# Patient Record
Sex: Male | Born: 1940 | Race: White | Hispanic: No | Marital: Married | Smoking: Former smoker
Health system: Southern US, Community
[De-identification: ages and names within clinical notes are randomized; demographics above are authoritative.]

## PROBLEM LIST (undated history)

## (undated) DIAGNOSIS — R079 Chest pain, unspecified: Secondary | ICD-10-CM

## (undated) DIAGNOSIS — I4891 Unspecified atrial fibrillation: Secondary | ICD-10-CM

## (undated) DIAGNOSIS — I200 Unstable angina: Secondary | ICD-10-CM

## (undated) DIAGNOSIS — E782 Mixed hyperlipidemia: Secondary | ICD-10-CM

## (undated) DIAGNOSIS — R9430 Abnormal result of cardiovascular function study, unspecified: Secondary | ICD-10-CM

## (undated) DIAGNOSIS — C61 Malignant neoplasm of prostate: Secondary | ICD-10-CM

## (undated) DIAGNOSIS — I10 Essential (primary) hypertension: Secondary | ICD-10-CM

## (undated) HISTORY — PX: HERNIA REPAIR: SHX51

## (undated) HISTORY — PX: PROSTATECTOMY: SHX69

---

## 1998-01-18 ENCOUNTER — Encounter: Payer: Self-pay | Admitting: *Deleted

## 1998-01-18 ENCOUNTER — Ambulatory Visit (HOSPITAL_COMMUNITY): Admission: RE | Admit: 1998-01-18 | Discharge: 1998-01-18 | Payer: Self-pay | Admitting: *Deleted

## 1999-11-10 ENCOUNTER — Inpatient Hospital Stay (HOSPITAL_COMMUNITY): Admission: EM | Admit: 1999-11-10 | Discharge: 1999-11-11 | Payer: Self-pay | Admitting: Emergency Medicine

## 1999-11-10 ENCOUNTER — Encounter: Payer: Self-pay | Admitting: Emergency Medicine

## 1999-12-18 ENCOUNTER — Ambulatory Visit (HOSPITAL_BASED_OUTPATIENT_CLINIC_OR_DEPARTMENT_OTHER): Admission: RE | Admit: 1999-12-18 | Discharge: 1999-12-18 | Payer: Self-pay | Admitting: Family Medicine

## 2001-06-09 ENCOUNTER — Encounter (INDEPENDENT_AMBULATORY_CARE_PROVIDER_SITE_OTHER): Payer: Self-pay

## 2001-06-09 ENCOUNTER — Ambulatory Visit (HOSPITAL_COMMUNITY): Admission: RE | Admit: 2001-06-09 | Discharge: 2001-06-09 | Payer: Self-pay | Admitting: Gastroenterology

## 2002-08-07 ENCOUNTER — Encounter (HOSPITAL_BASED_OUTPATIENT_CLINIC_OR_DEPARTMENT_OTHER): Payer: Self-pay | Admitting: General Surgery

## 2002-08-12 ENCOUNTER — Encounter (INDEPENDENT_AMBULATORY_CARE_PROVIDER_SITE_OTHER): Payer: Self-pay | Admitting: *Deleted

## 2002-08-12 ENCOUNTER — Ambulatory Visit (HOSPITAL_COMMUNITY): Admission: RE | Admit: 2002-08-12 | Discharge: 2002-08-12 | Payer: Self-pay | Admitting: General Surgery

## 2010-02-14 ENCOUNTER — Encounter: Admission: RE | Admit: 2010-02-14 | Discharge: 2010-02-14 | Payer: Self-pay | Admitting: Family Medicine

## 2010-08-18 NOTE — H&P (Signed)
[facility]  Patient:    Vincent Hampton, Vincent Hampton                   MRN: [id number] Adm. Date:  11/10/99 Attending:  Francisca December, M.D. Dictator:   Anselm Lis, N.P. CC:         Anna Genre. Little, M.D.   History and Physical  DATE OF BIRTH:  27-Oct-1940  PRIMARY CARE Jonovan Boedecker:  Dr. Caryn Bee L. Little.  HISTORY OF PRESENT ILLNESS:  Vincent Hampton is a pleasant 70 year old male with a history of paroxysmal atrial fibrillation, first noted four years earlier.  He does have recurrent episodes that occur approximately twice a year and last for 20-30 minutes and seem to resolve with taking an additional Digoxin tablet.  Yesterday at approximately 11 a.m. he felt a "funny feeling" in his chest, typical when he has recurrent atrial fibrillation.  His pulse was irregular and rapid (question approximately 150s).  He felt malaise.  His wife noted that he looked clammy.  He took an additional Digoxin tablet.  At approximately 1 p.m. he took a nap, another tablet at approximately 2 p.m., and another tablet later that night.  Last evening he had transient left anterior chest achiness which he described as not mild and nonpleuritic.  This morning he felt okay, took his Digoxin tablet, ate breakfast, and then felt nauseated.  His blood pressure was 90/70.  He took an additional Digoxin and reported to the [facility] Emergency Room.  His electrocardiogram revealed atrial flutter, a 3:1 block, rate at 70.  His CBC and CMET were okay.  His CPK was 54 with MB fraction and troponin I pending. His digitalis level was 1.7.  A chest x-ray was clear.  PAST MEDICAL HISTORY: 1. Paroxysmal atrial fibrillation, first noted in May 1997, with rare    episodic recurrence.  Managed conservatively. 2. History of exertional chest pain.  In April 1997, had a stress thallium    which was normal. 3. History of prostate cancer with a radical prostatectomy in 1994.   No    chemotherapy or radiation therapy. 4. Right herniorrhaphy in 1996. 5. Seasonal allergies, for which he takes Claritin, with improvement. 6. Resection of left facial basal carcinoma. 7. Sleep apnea diagnosed in 1987, untreated. 8. Mitral valve prolapse by echocardiogram in April 1997.  Left atrial size    of 2.6 cm.  Normal left ventricle.  PAST SURGICAL HISTORY: 1. Prostatectomy. 2. Right hernia repair. 3. Basal cell carcinoma removal from left cheek. 4. Tonsillectomy.  ALLERGIES:  No known drug allergies.  No problems with sea food, shell fish, or iodinated products.  CURRENT MEDICATIONS: 1. Claritin 10 mg p.o. q.d. 2. Coated aspirin q.o.d. or so. 3. Digoxin 0.25 mg p.o. q.d. 4. Multivitamin q.d.  SOCIAL HISTORY/HABITS:  Tobacco:  Quit in 1982.  Prior smoked two to three packs per day for 30 years.  ETOH:  Negative.  Caffeine:  About equivalent of one cup a day.  The patient has been married for 18 years (second marriage). He has two daughters and one son alive and well from his first marriage.  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  FAMILY HISTORY:  His mother is age 13 and may have Alzheimers.  His father died at age 42, question of prostate cancer with metastasis.  He has a sister age 52, alive and well.  REVIEW OF SYSTEMS:  As  in the HPI and past medical history, otherwise wears progressive glasses.  Denies problems with lightheadedness, syncope, or near syncopal episodes.  Negative problems hearing.  Negative dysphagia to food or fluid.  No constipation, diarrhea, melena, or bright red blood per rectum. Negative dysuria or hematuria.  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  Negative pedal edema.  No orthopnea or PND.  PHYSICAL EXAMINATION:  VITAL SIGNS:  Blood pressure 106/88, heart rate initially 114, now 71, afebrile.  GENERAL:  He is a slender midde-aged male, in no apparent distress.  His  wife was in attendance.  HEENT/NECK:  Brisk bilateral carotid upstrokes without bruit.  No significant jugular venous distention.  CHEST:  Lung sounds clear with equal bilateral excursion.  CARDIAC:  A regular rate and rhythm without murmur, rub, or gallop.  Normal S1 and S2.  ABDOMEN:  Soft, nondistended.  Normoactive bowel sounds.  Negative abdominal aortic, renal, or femoral bruits.  No masses, no organomegaly appreciated, nontender to applied pressure.  EXTREMITIES:  With +2/4 bilateral radial, femoral, dorsalis pedis, and posterior tibial pulses.  Negative pedal edema.  NEUROLOGIC:  Cranial nerves II-XII grossly intact.  Alert and oriented x 3.  GENITOURINARY:  Deferred.  RECTAL:  Deferred.  LABORATORY DATA:  Sodium 140, K of 4, chloride 106, CO2 of 27, BUN 20, creatinine 1.0, glucose 140.  Liver function tests within normal limits. Hemoglobin 15.4, wbcs 7.7, platelets 323.  CPK 54 with MB fraction and troponin I pending.  Digoxin level 1.7.  Coags are pending.  Chest x-ray revealed COPD, no active disease.  Old granulomatous disease. Scoliosis.  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  IMPRESSION: 1. Recurrent paroxysmal atrial fibrillation with initial right ventricular    response, currently 3:1 flutter, with controlled ventricular rate, after    the patient is self-loaded with Digoxin.  His Digoxin level is 1.7.  The    patient had an echocardiogram in 1997, which was essentially normal,    with left atrial size of 2.6 cm.  Evidence of mitral valve prolapse. 2. History of sleep apnea, untreated. 3. History of chest discomfort which was evaluated by a stress thallium    in 1997, which was negative for ischemia.  PLAN: 1. Admit to telemetry, rule out myocardial infarction protocol, with     serial  cardiac enzymes and electrocardiogram. 2. Will recheck a 2-D echocardiogram, assessing for valvular or structural    problems. 3. Initiation of antidysrhythmic medication, choice pending Dr. Francisca December review. 4. Will initiate systemic anticoagulation. 5. Recheck TSH.DD:  11/10/99 TD:  11/10/99 Job: [id number]

## 2010-08-18 NOTE — Op Note (Signed)
NAME:  Vincent Hampton, Vincent Hampton NO.:  [id number]   MEDICAL RECORD NO.:  [id number]                   PATIENT TYPE:  OIB   LOCATION:  NA                                   FACILITY:  [facility]   PHYSICIAN:  Leonie Man, M.D.                DATE OF BIRTH:  [birth year]   DATE OF PROCEDURE:  08/12/2002  DATE OF DISCHARGE:                                 OPERATIVE REPORT   PREOPERATIVE DIAGNOSIS:  Left inguinal hernia.   POSTOPERATIVE DIAGNOSIS:  Left sliding inguinal hernia with direct  component.   PROCEDURE:  Repair of left sliding hernia.   SURGEON:  Leonie Man, M.D.   ASSISTANT:  Nurse.   ANESTHESIA:  General.   INDICATIONS:  This patient is a 70 year old man who is status post right  inguinal hernia approximately 15 years ago, who presents now with left-sided  groin bulge and pain, on evaluation noted to be a left inguinal hernia.  He  comes to the operating room following full discussion of the risks and  potential benefits of surgery.  All questions were answered and consent  obtained.   PROCEDURE:  Following the induction of satisfactory general anesthesia with  the patient positioned supinely, the left groin was prepped and draped and  included in the sterile operative field.  The lower abdominal crease on the  left side was infiltrated with 0.5% Marcaine with epinephrine and a  transverse incision made symmetrically to the right side of the incision.  It was carried down through the skin and subcutaneous tissues and extended  the dissection down to the external oblique aponeurosis.  Prior to the  injections of Marcaine that were used for that procedure, the external  oblique aponeurosis was opened up through the external inguinal ring with  protection of the __________ nerve which was retracted medially and  cephalad.  The spermatic cord was then dissected free from the transversalis  fascia and held with a Penrose drain.  Dissection around  the anteromedial  aspect of the cord revealed a large hernial sac, which included a portion of  the small bowel, which was intimately adhered to the medial wall of the sac.  The sac was opened.  The small bowel was dissected free and reduced into the  peritoneal cavity.  A pursestring suture of 2-0 silk was used to encircle  the sac.  The redundant sac was then amputated.  The direct floor was then  repaired with a polypropylene mesh, which was sewn in at the pubic tubercle  with a 2-0 Novofil suture and continued up along the conjoined tendon up to  the internal ring.  Using another suture from the pubic tubercle, which was  run with the mesh up along the shelving edge of the inguinal ligament up to  the internal ring, the mesh was split so as to allow easy protrusion of the  spermatic cord.  The tails  of the mesh were then trimmed and sutured into  the internal oblique muscles superior and lateral to the cord. All areas of  dissection were then checked for hemostasis.  Additional injections of 0.5%  Marcaine were used so as to prolong his analgesia.  Sponge, instruments, and  sharp counts were verified.  The external oblique aponeurosis was closed  over the spermatic cord with a running 2-0 Vicryl suture.  The Scarpa's  fascia and subcutaneous tissues were closed with a running 3-0 Vicryl  suture, and the skin was closed with a 4-0 Monocryl suture, which was placed  subcuticularly.  The wound was then reinforced with Steri-Strips.  Sterile  dressings were applied.  The anesthetic reversed, and the patient was  removed from the operating room to the recovery room in stable condition.  He tolerated the procedure well.                                               Leonie Man, M.D.    PB/MEDQ  D:  08/12/2002  T:  08/13/2002  Job:  [id number]   cc:   Francisca December, M.D.  [address]  [address]  [place]  [place] [address]  Fax: [phone]   Anna Genre. Little, M.D.  [address]  [place]  [place] [address]  Fax: [phone]   Donnal Debar. Gasper Sells, M.D.  [address]  [place]  [place] [address]  Fax: [phone]

## 2010-08-18 NOTE — Procedures (Signed)
[facility]  Patient:    Vincent Hampton, Vincent Hampton Visit Number: [id number] MRN: [id number]          Service Type: END Location: ENDO Attending Physician:  Orland Mustard Dictated by:   Llana Aliment. Randa Evens, M.D. Proc. Date: 06/09/01 Admit Date:  06/09/2001   CC:         Caryn Bee L. Little, M.D.   Procedure Report  DATE OF BIRTH:  13-Sep-1940  PROCEDURE:  Colonoscopy and polypectomy.  MEDICATIONS:  Fentanyl 75 mcg, Versed 7 mg IV.  SCOPE:  Olympus adult video colonoscope.  INDICATIONS FOR PROCEDURE:  Colon cancer screening.  DESCRIPTION OF PROCEDURE:  The procedure had been explained to the patient and consent obtained. With the patient in the left lateral decubitus position, the Olympus adult video colonoscope was inserted and advanced under direct visualization. The prep was excellent. We were able to advance around to the cecum and the ileocecal valve was seen. The appendiceal orifice was seen. There was a 1/2 cm sessile polyp within about 4 cm of the appendix. This was removed with the snare and sucked through the scope. There was no significant bleeding at the polypectomy site. The scope was withdrawn and the ascending colon, hepatic flexure, transverse colon, splenic flexure, descending, and sigmoid colon were seen well. No further polyps were seen. There was on significant diverticular disease. The rectum was also seen well and was free of polyps. The scope withdrawn. The patient tolerated the procedure well maintained on low flow oxygen and pulse oximeter throughout the procedure.  ASSESSMENT:  Cecal polyp removed.  PLAN:  Routine post polypectomy instructions. Will recommend repeating procedure in three years. Dictated by:   Llana Aliment. Randa Evens, M.D. Attending Physician:  Orland Mustard DD:  06/09/01 TD:  06/10/01 Job: [id number]

## 2011-06-18 DIAGNOSIS — M2550 Pain in unspecified joint: Secondary | ICD-10-CM | POA: Diagnosis not present

## 2011-06-18 DIAGNOSIS — Z79899 Other long term (current) drug therapy: Secondary | ICD-10-CM | POA: Diagnosis not present

## 2011-07-16 DIAGNOSIS — Z85828 Personal history of other malignant neoplasm of skin: Secondary | ICD-10-CM | POA: Diagnosis not present

## 2011-07-16 DIAGNOSIS — L821 Other seborrheic keratosis: Secondary | ICD-10-CM | POA: Diagnosis not present

## 2011-09-04 DIAGNOSIS — D2310 Other benign neoplasm of skin of unspecified eyelid, including canthus: Secondary | ICD-10-CM | POA: Diagnosis not present

## 2011-09-04 DIAGNOSIS — H2510 Age-related nuclear cataract, unspecified eye: Secondary | ICD-10-CM | POA: Diagnosis not present

## 2011-12-17 DIAGNOSIS — I10 Essential (primary) hypertension: Secondary | ICD-10-CM | POA: Diagnosis not present

## 2011-12-17 DIAGNOSIS — I4891 Unspecified atrial fibrillation: Secondary | ICD-10-CM | POA: Diagnosis not present

## 2011-12-20 DIAGNOSIS — L719 Rosacea, unspecified: Secondary | ICD-10-CM | POA: Diagnosis not present

## 2011-12-20 DIAGNOSIS — D239 Other benign neoplasm of skin, unspecified: Secondary | ICD-10-CM | POA: Diagnosis not present

## 2011-12-20 DIAGNOSIS — L821 Other seborrheic keratosis: Secondary | ICD-10-CM | POA: Diagnosis not present

## 2011-12-20 DIAGNOSIS — Z85828 Personal history of other malignant neoplasm of skin: Secondary | ICD-10-CM | POA: Diagnosis not present

## 2012-01-14 DIAGNOSIS — Z23 Encounter for immunization: Secondary | ICD-10-CM | POA: Diagnosis not present

## 2012-03-28 DIAGNOSIS — R21 Rash and other nonspecific skin eruption: Secondary | ICD-10-CM | POA: Diagnosis not present

## 2012-04-24 ENCOUNTER — Observation Stay (HOSPITAL_COMMUNITY)
Admission: EM | Admit: 2012-04-24 | Discharge: 2012-04-25 | Disposition: A | Discharge status: Home / Self Care | Payer: Medicare Other | Attending: Interventional Cardiology | Admitting: Interventional Cardiology

## 2012-04-24 ENCOUNTER — Encounter (HOSPITAL_COMMUNITY): Payer: Self-pay | Admitting: Emergency Medicine

## 2012-04-24 ENCOUNTER — Emergency Department (HOSPITAL_COMMUNITY): Payer: Medicare Other

## 2012-04-24 DIAGNOSIS — E782 Mixed hyperlipidemia: Secondary | ICD-10-CM | POA: Diagnosis not present

## 2012-04-24 DIAGNOSIS — J449 Chronic obstructive pulmonary disease, unspecified: Secondary | ICD-10-CM | POA: Diagnosis not present

## 2012-04-24 DIAGNOSIS — R5381 Other malaise: Secondary | ICD-10-CM | POA: Diagnosis not present

## 2012-04-24 DIAGNOSIS — Z87891 Personal history of nicotine dependence: Secondary | ICD-10-CM | POA: Insufficient documentation

## 2012-04-24 DIAGNOSIS — R5383 Other fatigue: Secondary | ICD-10-CM | POA: Insufficient documentation

## 2012-04-24 DIAGNOSIS — I4510 Unspecified right bundle-branch block: Secondary | ICD-10-CM | POA: Insufficient documentation

## 2012-04-24 DIAGNOSIS — Z7902 Long term (current) use of antithrombotics/antiplatelets: Secondary | ICD-10-CM | POA: Diagnosis not present

## 2012-04-24 DIAGNOSIS — E785 Hyperlipidemia, unspecified: Secondary | ICD-10-CM | POA: Diagnosis not present

## 2012-04-24 DIAGNOSIS — I4891 Unspecified atrial fibrillation: Secondary | ICD-10-CM | POA: Diagnosis not present

## 2012-04-24 DIAGNOSIS — M79609 Pain in unspecified limb: Secondary | ICD-10-CM | POA: Insufficient documentation

## 2012-04-24 DIAGNOSIS — R079 Chest pain, unspecified: Secondary | ICD-10-CM | POA: Diagnosis not present

## 2012-04-24 DIAGNOSIS — I209 Angina pectoris, unspecified: Secondary | ICD-10-CM | POA: Diagnosis not present

## 2012-04-24 DIAGNOSIS — Z79899 Other long term (current) drug therapy: Secondary | ICD-10-CM | POA: Insufficient documentation

## 2012-04-24 DIAGNOSIS — R0789 Other chest pain: Principal | ICD-10-CM | POA: Insufficient documentation

## 2012-04-24 HISTORY — DX: Essential (primary) hypertension: I10

## 2012-04-24 HISTORY — DX: Unspecified atrial fibrillation: I48.91

## 2012-04-24 HISTORY — DX: Mixed hyperlipidemia: E78.2

## 2012-04-24 HISTORY — DX: Malignant neoplasm of prostate: C61

## 2012-04-24 HISTORY — DX: Chest pain, unspecified: R07.9

## 2012-04-24 LAB — COMPREHENSIVE METABOLIC PANEL
ALT: 23 U/L (ref 0–53)
AST: 22 U/L (ref 0–37)
Albumin: 3.6 g/dL (ref 3.5–5.2)
CO2: 30 mEq/L (ref 19–32)
Chloride: 99 mEq/L (ref 96–112)
GFR calc non Af Amer: 58 mL/min — ABNORMAL LOW (ref >90)
Potassium: 3.6 mEq/L (ref 3.5–5.1)
Sodium: 139 mEq/L (ref 135–145)
Total Bilirubin: 0.3 mg/dL (ref 0.3–1.2)

## 2012-04-24 LAB — PROTIME-INR: INR: 0.9 (ref 0.00–1.49)

## 2012-04-24 LAB — CBC WITH DIFFERENTIAL/PLATELET
Basophils Absolute: 0 10*3/uL (ref 0.0–0.1)
Basophils Relative: 1 % (ref 0–1)
HCT: 41.7 % (ref 39.0–52.0)
Lymphocytes Relative: 35 % (ref 12–46)
Monocytes Absolute: 0.5 10*3/uL (ref 0.1–1.0)
Neutro Abs: 3.2 10*3/uL (ref 1.7–7.7)
Neutrophils Relative %: 50 % (ref 43–77)
Platelets: 251 10*3/uL (ref 150–400)
RDW: 12.6 % (ref 11.5–15.5)
WBC: 6.4 10*3/uL (ref 4.0–10.5)

## 2012-04-24 MED ORDER — ONDANSETRON HCL 4 MG/2ML IJ SOLN
4.0000 mg | Freq: Once | INTRAMUSCULAR | Status: AC
  Administered 2012-04-24: 4 mg via INTRAVENOUS
  Filled 2012-04-24: qty 2

## 2012-04-24 MED ORDER — MORPHINE SULFATE 4 MG/ML IJ SOLN
4.0000 mg | Freq: Once | INTRAMUSCULAR | Status: AC
  Administered 2012-04-24: 4 mg via INTRAVENOUS
  Filled 2012-04-24: qty 1

## 2012-04-24 MED ORDER — NITROGLYCERIN 2 % TD OINT
0.5000 [in_us] | TOPICAL_OINTMENT | TRANSDERMAL | Status: AC
  Administered 2012-04-25: 0.5 [in_us] via TOPICAL
  Filled 2012-04-24: qty 1

## 2012-04-24 MED ORDER — NITROGLYCERIN 0.4 MG SL SUBL: 0.4000 mg | SUBLINGUAL_TABLET | SUBLINGUAL | Status: DC | PRN

## 2012-04-24 NOTE — ED Notes (Signed)
PT. REPORTS INTERMITTENT CHEST PAIN FOR SEVERAL DAYS AND THIS EVENING , LEFT SIDED CHEST PAIN AND LEFT ARM PAIN ONSET THIS EVENING , DENIES SOB , NAUSEA OR DIAPHORESIS , STATES HISTORY OF ATRIAL FIBRILLATION ( CARDIOLOGIST IS DR. VARANASI WITH [facility] ).

## 2012-04-24 NOTE — ED Provider Notes (Signed)
History  This chart was scribed for Vincent Octave, MD by Bennett Scrape, ED Scribe. This patient was seen in room D31C/D31C and the patient's care was started at 10:06 PM.  CSN: [id number]  Arrival date & time 04/24/12  1959   First MD Initiated Contact with Patient 04/24/12 2206      Chief Complaint  Patient presents with  . Chest Pain    The history is provided by the patient. No language interpreter was used.    Vincent Hampton is a 72 y.o. male who presents to the Emergency Department complaining of 4 to 5 days of sudden onset, non-changing, intermittent episodes of ventral bilateral arm pain that is worse on the left described as a ache with associated fatigue. He reports that he has been experiencing 2 to 3 episodes of pain lasting 20 to 30 minutes at a time daily and states that occasionally the pain will radiate into the left chest. He reports that he came in for evaluation tonight because this episode tonight has been longer lasting than the previous episodes (going on for approximately 2 hours).  He states that he is having left forearm pain currently. He denies any recent heavy lifting, falls or injuries to the extremities. He reports taking one baby ASA and two Advil today with no improvement. He denies having prior episodes of similar symptoms. He has a h/o A. Fib and reports that he is on lanoxin. He denies having any stents or prior MIs. He states that his last stress test was several years ago. He denies diaphoresis, SOB, nausea, emesis, numbness, weakness, abdominal pain, lightheadedness and dizziness as associated symptoms. He is a former smoker but denies alcohol use.  Cardiologist is Dr. Eldridge Dace with [facility].   Past Medical History  Diagnosis Date  . Atrial fibrillation   . Hypertension   . Prostate cancer     Past Surgical History  Procedure Date  . Prostatectomy   . Hernia repair     No family history on file.  History  Substance Use Topics  . Smoking  status: Former Games developer  . Smokeless tobacco: Not on file  . Alcohol Use: No      Review of Systems  A complete 10 system review of systems was obtained and all systems are negative except as noted in the HPI and PMH.   Allergies  Review of patient's allergies indicates no known allergies.  Home Medications   Current Outpatient Rx  Name  Route  Sig  Dispense  Refill  . DIGOXIN 0.25 MG PO TABS   Oral   Take 0.25 mg by mouth daily.         [name] DIPHENHYDRAMINE HCL 25 MG PO TABS   Oral   Take 25 mg by mouth every 6 (six) hours as needed. For seasonal allergies         . SOTALOL HCL 80 MG PO TABS   Oral   Take 80 mg by mouth 2 (two) times daily.         . TRIAMTERENE-HCTZ 37.5-25 MG PO TABS   Oral   Take 1 tablet by mouth daily.           Triage Vitals: BP 164/96  Pulse 74  Temp 97.7 F (36.5 C) (Oral)  Resp 18  SpO2 98%  Physical Exam  Nursing note and vitals reviewed. Constitutional: He is oriented to person, place, and time. He appears well-developed and well-nourished. No distress.  HENT:  Head: Normocephalic and atraumatic.  Mouth/Throat: Oropharynx is clear and moist.  Eyes: Conjunctivae normal and EOM are normal. Pupils are equal, round, and reactive to light.  Neck: Neck supple. No tracheal deviation present.  Cardiovascular: Normal rate and regular rhythm.   Pulmonary/Chest: Effort normal and breath sounds normal. No respiratory distress. He exhibits no tenderness (no reproducible chest tenderness).  Abdominal: Soft. There is no tenderness.  Musculoskeletal: Normal range of motion. He exhibits no edema and no tenderness.  Neurological: He is alert and oriented to person, place, and time. No cranial nerve deficit.       No ataxia on finger to nose, 5/5 strength throughout, no pronator's drift, no sensory or motor deficits, equal grip strengths   Skin: Skin is warm and dry.  Psychiatric: He has a normal mood and affect. His behavior is normal.    ED  Course  Procedures (including critical care time)  DIAGNOSTIC STUDIES: Oxygen Saturation is 98% on room air, normal by my interpretation.    COORDINATION OF CARE: 10:16 PM-Discussed treatment plan which includes pain medication, CXR, CBC panel, and protime-INR with pt at bedside and pt agreed to plan.    Labs Reviewed  CBC WITH DIFFERENTIAL - Abnormal; Notable for the following:    Eosinophils Relative 8 (*)     All other components within normal limits  COMPREHENSIVE METABOLIC PANEL - Abnormal; Notable for the following:    Glucose, Bld 148 (*)     BUN 25 (*)     GFR calc non Af Amer 58 (*)     GFR calc Af Amer 67 (*)     All other components within normal limits  POCT I-STAT TROPONIN I   Dg Chest 2 View  04/24/2012  *RADIOLOGY REPORT*  Clinical Data: Chest and left arm pain.  CHEST - 2 VIEW  Comparison: None.  Findings: Normal sized heart.  Clear lungs.  The lungs are mildly hyperexpanded.  Lingular calcified granuloma and calcified left hilar lymph nodes.  Moderate scoliosis.  IMPRESSION: Mild changes of COPD.  No acute abnormality.   Original Report Authenticated By: Beckie Salts, M.D.      No diagnosis found.    MDM  Patient presents with intermittent left arm pain for several days that progressed to his left chest this evening. No shortness of breath, nausea diaphoresis. History of atrial fibrillation now in sinus rhythm. Negative stress test several years ago. No history of CAD or MI.  Troponin negative. No acute ischemic changes on EKG. Concern that patient's left arm pain is angina equivalent. Discussed with Dr. Adolm Joseph of cardiology. He plans to admit patient for rule out.   Date: 04/24/2012  Rate: 68  Rhythm: normal sinus rhythm  QRS Axis: left  Intervals: normal  ST/T Wave abnormalities: nonspecific ST/T changes  Conduction Disutrbances:right bundle branch block and left anterior fascicular block  Narrative Interpretation:   Old EKG Reviewed: none  available       I personally performed the services described in this documentation, which was scribed in my presence. The recorded information has been reviewed and is accurate.    Vincent Octave, MD 04/24/12 [phone]

## 2012-04-25 ENCOUNTER — Encounter (HOSPITAL_COMMUNITY): Payer: Self-pay | Admitting: *Deleted

## 2012-04-25 DIAGNOSIS — R079 Chest pain, unspecified: Secondary | ICD-10-CM | POA: Insufficient documentation

## 2012-04-25 DIAGNOSIS — I4891 Unspecified atrial fibrillation: Secondary | ICD-10-CM | POA: Diagnosis not present

## 2012-04-25 DIAGNOSIS — E782 Mixed hyperlipidemia: Secondary | ICD-10-CM | POA: Diagnosis not present

## 2012-04-25 DIAGNOSIS — I10 Essential (primary) hypertension: Secondary | ICD-10-CM | POA: Insufficient documentation

## 2012-04-25 LAB — BASIC METABOLIC PANEL
CO2: 28 mEq/L (ref 19–32)
Chloride: 101 mEq/L (ref 96–112)
Creatinine, Ser: 0.99 mg/dL (ref 0.50–1.35)
Glucose, Bld: 96 mg/dL (ref 70–99)

## 2012-04-25 LAB — HEMOGLOBIN A1C
Hgb A1c MFr Bld: 5.9 % — ABNORMAL HIGH (ref <5.7)
Mean Plasma Glucose: 123 mg/dL — ABNORMAL HIGH (ref <117)

## 2012-04-25 LAB — TROPONIN I: Troponin I: <0.3 ng/mL (ref <0.30)

## 2012-04-25 LAB — LIPID PANEL
LDL Cholesterol: 177 mg/dL — ABNORMAL HIGH (ref 0–99)
Triglycerides: 229 mg/dL — ABNORMAL HIGH (ref <150)

## 2012-04-25 MED ORDER — DIGOXIN 250 MCG PO TABS
0.2500 mg | ORAL_TABLET | Freq: Every day | ORAL | Status: DC
  Administered 2012-04-25: 0.25 mg via ORAL
  Filled 2012-04-25: qty 1

## 2012-04-25 MED ORDER — SIMVASTATIN 40 MG PO TABS: 40.0000 mg | ORAL_TABLET | Freq: Every day | ORAL | Status: DC

## 2012-04-25 MED ORDER — SODIUM CHLORIDE 0.9 % IV SOLN: 250.0000 mL | INTRAVENOUS | Status: DC | PRN

## 2012-04-25 MED ORDER — ONDANSETRON HCL 4 MG/2ML IJ SOLN: 4.0000 mg | Freq: Four times a day (QID) | INTRAMUSCULAR | Status: DC | PRN

## 2012-04-25 MED ORDER — SODIUM CHLORIDE 0.9 % IJ SOLN
3.0000 mL | INTRAMUSCULAR | Status: DC | PRN
  Administered 2012-04-25: 3 mL via INTRAVENOUS

## 2012-04-25 MED ORDER — ENOXAPARIN SODIUM 40 MG/0.4ML ~~LOC~~ SOLN
40.0000 mg | SUBCUTANEOUS | Status: DC
  Filled 2012-04-25: qty 0.4

## 2012-04-25 MED ORDER — SODIUM CHLORIDE 0.9 % IJ SOLN
3.0000 mL | Freq: Two times a day (BID) | INTRAMUSCULAR | Status: DC
  Administered 2012-04-25: 3 mL via INTRAVENOUS

## 2012-04-25 MED ORDER — TRIAMTERENE-HCTZ 37.5-25 MG PO TABS
1.0000 | ORAL_TABLET | Freq: Every day | ORAL | Status: DC
  Administered 2012-04-25: 1 via ORAL
  Filled 2012-04-25: qty 1

## 2012-04-25 MED ORDER — NITROGLYCERIN 0.4 MG SL SUBL: 0.4000 mg | SUBLINGUAL_TABLET | SUBLINGUAL | Status: DC | PRN

## 2012-04-25 MED ORDER — ASPIRIN 81 MG PO TBEC: 81.0000 mg | DELAYED_RELEASE_TABLET | Freq: Every day | ORAL | Status: DC

## 2012-04-25 MED ORDER — SIMVASTATIN 40 MG PO TABS
40.0000 mg | ORAL_TABLET | Freq: Every day | ORAL | Status: DC
  Administered 2012-04-25: 40 mg via ORAL
  Filled 2012-04-25 (×2): qty 1

## 2012-04-25 MED ORDER — ACETAMINOPHEN 325 MG PO TABS: 650.0000 mg | ORAL_TABLET | ORAL | Status: DC | PRN

## 2012-04-25 MED ORDER — ASPIRIN EC 81 MG PO TBEC
81.0000 mg | DELAYED_RELEASE_TABLET | Freq: Every day | ORAL | Status: DC
  Administered 2012-04-25: 81 mg via ORAL
  Filled 2012-04-25: qty 1

## 2012-04-25 MED ORDER — SOTALOL HCL 80 MG PO TABS
80.0000 mg | ORAL_TABLET | Freq: Two times a day (BID) | ORAL | Status: DC
  Administered 2012-04-25 (×2): 80 mg via ORAL
  Filled 2012-04-25 (×3): qty 1

## 2012-04-25 NOTE — Progress Notes (Signed)
Reviewed discharge instructions with patient and family, they stated their understanding.  Patient discharged home with wife.  Vincent Hampton

## 2012-04-25 NOTE — H&P (Signed)
History and Physical  Patient ID: Vincent Hampton MRN: [id number], SOB: 12/03/1940 73 y.o. Date of Encounter: 04/25/2012, 12:02 AM  Primary Physician: No primary provider on file. Primary Cardiologist: Dr. Eldridge Dace  Chief Complaint: arm and chest pain  HPI: 72 y.o. male w/ PMHx significant for paroxysmal afib, HTN who presented to [facility] on 04/24/2012 with complaints of arm and chest pain. He reports being in his normal state of health 1 week ago when he first started noticing symptoms of left forearm pain. Not associated with exertion, occurring at various times including at rest. Aching sensation of varying severity that lasted 20-40 minutes at a time. No ability to relieve the pain include rubbing or resting. 2-3 episodes a day, no known associations with any certain activities, neck position, etc. On day of admission, however, the pain in the forearm increased significantly in severity after dinner and then radiated up into his shoulder and left axillary position. Also felt pain in right forearm as well. Lasted for 30 minutes, no improvement with lying on the couch. No associated symptoms of diaphoresis, palpitations, syncope, presyncope, shortness of breath. He reports being an active individual and has no exertional symptoms.   In the ER, given aspirin and morphine. Pain resolved in chest prior to ER but arm pain persisted. Currently, he has no chest pain but his forearm is slightly aching.  He reports undergoing an outpatient treadmill stress test in the last 2-3 years and tells me it was negative.  EKG revealed NSR, bifasc block with no acute ST changes in unaffected leads. CXR was without acute cardiopulmonary abnormalities. Labs are significant for mildly elevated glucose.   Past Medical History  Diagnosis Date  . Atrial fibrillation- paroxysmal, on sotalol and digoxin   . Hypertension   . Prostate cancer    ?Pre-diabetes  Surgical History:  Past Surgical History    Procedure Date  . Prostatectomy   . Hernia repair      Home Meds: Prior to Admission medications   Medication Sig Start Date End Date Taking? Authorizing Provider  digoxin (LANOXIN) 0.25 MG tablet Take 0.25 mg by mouth daily.   Yes Historical Provider, MD  diphenhydrAMINE (BENADRYL) 25 MG tablet Take 25 mg by mouth every 6 (six) hours as needed. For seasonal allergies   Yes Historical Provider, MD  sotalol (BETAPACE) 80 MG tablet Take 80 mg by mouth 2 (two) times daily.   Yes Historical Provider, MD  triamterene-hydrochlorothiazide (MAXZIDE-25) 37.5-25 MG per tablet Take 1 tablet by mouth daily.   Yes Historical Provider, MD    Allergies: No Known Allergies  History   Social History  . Marital Status: Married    Spouse Name: N/A    Number of Children: N/A  . Years of Education: N/A   Occupational History  . Not on file.   Social History Main Topics  . Smoking status: Former Games developer  . Smokeless tobacco: Not on file  . Alcohol Use: No  . Drug Use: No  . Sexually Active:    Other Topics Concern  . Not on file   Social History Narrative  . No narrative on file     FH: no FH of CVD in parents or siblings.  Review of Systems: General: negative for chills, fever, night sweats or weight changes.  Cardiovascular: see HPI Dermatological: negative for rash Respiratory: negative for cough or wheezing Urologic: negative for hematuria Abdominal: negative for nausea, vomiting, diarrhea, bright red blood per rectum, melena, or hematemesis Neurologic:  negative for visual changes, syncope, or dizziness All other systems reviewed and are otherwise negative except as noted above.  Labs:   Lab Results  Component Value Date   WBC 6.4 04/24/2012   HGB 14.9 04/24/2012   HCT 41.7 04/24/2012   MCV 89.9 04/24/2012   PLT 251 04/24/2012    Lab 04/24/12 2021  NA 139  K 3.6  CL 99  CO2 30  BUN 25*  CREATININE 1.21  CALCIUM 9.4  PROT 6.9  BILITOT 0.3  ALKPHOS 77  ALT 23  AST  22  GLUCOSE 148*   No results found for this basename: CKTOTAL:4,CKMB:4,TROPONINI:4 in the last 72 hours No results found for this basename: CHOL, HDL, LDLCALC, TRIG   No results found for this basename: DDIMER    Radiology/Studies:  Dg Chest 2 View  04/24/2012  *RADIOLOGY REPORT*  Clinical Data: Chest and left arm pain.  CHEST - 2 VIEW  Comparison: None.  Findings: Normal sized heart.  Clear lungs.  The lungs are mildly hyperexpanded.  Lingular calcified granuloma and calcified left hilar lymph nodes.  Moderate scoliosis.  IMPRESSION: Mild changes of COPD.  No acute abnormality.   Original Report Authenticated By: Beckie Salts, M.D.      EKG: see HPI, none available for comparison  Physical Exam: Blood pressure 150/89, pulse 64, temperature 97.5 F (36.4 C), temperature source Oral, resp. rate 15, height 6\' 2"  (1.88 m), weight 65.772 kg (145 lb), SpO2 99.00%. General: Well developed, well nourished, in no acute distress. Head: Normocephalic, atraumatic, sclera non-icteric, nares are without discharge Neck: Supple. Negative for carotid bruits. JVD not elevated. Lungs: Clear bilaterally to auscultation without wheezes, rales, or rhonchi. Breathing is unlabored. Heart: RRR with S1 S2. No murmurs, rubs, or gallops appreciated. Abdomen: Soft, non-tender, non-distended with normoactive bowel sounds. No rebound/guarding. No obvious abdominal masses. Msk:  Strength and tone appear normal for age. No abnormalities in left forearm, unable to provoke pain Extremities: No edema. No clubbing or cyanosis. Distal pedal pulses are 2+ and equal bilaterally. Neuro: Alert and oriented X 3. Moves all extremities spontaneously. Psych:  Responds to questions appropriately with a normal affect.   Problem List 1. Left arm and chest pain occurring at rest, ?unstable angina 2. HTN 3.  Former smoker, 20 pack years 4. ?Pre-diabetic, elevated nonfasting glucose 5. Paroxysmal afib, currently in sinus,  bifasicular block   ASSESSMENT AND PLAN:  72 yo male with risk factors of HTN, former smoker presenting with symptoms of left arm pain and left sided chest pain occurring at rest. Due to his risk profile combined with his symptoms (not classic but certainly concerning), further risk stratification in the hospital is warranted. Will admit, cycle troponins, monitor on telemetry and re-evaluate in the AM.  Continue his BB, aspirin, and add statin. With intermediate timi score, will hold on adding full strength anticoagulation unless recurrent symptoms or positive biomarkers. NPO for consideration of possible stress test vs. Catheterization (depending on his trajectory overnight).  Continue his sotalol and digoxin for PAF. Dig level pending.  Continue HCTZ/triamterene. Add nitropaste mainly for short-term BP management (150s).  Check HgA1c due to elevated glucose. Lipids in the AM.  Prophy: Low dose lovenox No GI prophy needed. Full code  Dr. Eldridge Dace or partner to followup in the AM.   Signed, Davetta Olliff C. MD 04/25/2012, 12:02 AM

## 2012-04-25 NOTE — Discharge Summary (Signed)
Patient ID: Vincent Hampton MRN: [id number] DOB/AGE: 06-Oct-1940 72 y.o.  Admit date: 04/24/2012 Discharge date: 04/25/2012  Primary Discharge Diagnosis Atypical chest pain Secondary Discharge Diagnosis atrial fibrillation, hyperlipidemia  Significant Diagnostic Studies: none  Consults: None  Hospital Course: 72 y/o man who had been having intermittent arm pain.  Yesterday, after eating, it moved to his left chest.  He came to the ER for evaluation.  He walked in the hospital without symptoms.  Tele was NSR.  He had an RBBB, but no ischemia on ECG.  We decided on outpatient stress testing.  I think his atypical chest pain is most likely orthopedic in nature, but given his age, it is not unreasonable to perform a provocative test.       Discharge Exam: Blood pressure 116/72, pulse 61, temperature 97.8 F (36.6 C), temperature source Oral, resp. rate 16, height 6\' 2"  (1.88 m), weight 64.3 kg (141 lb 12.1 oz), SpO2 95.00%.   [place]/AT RRR S1S2 CTA bilaterally 3+ radial pulses bilaterally No edema Labs:   Lab Results  Component Value Date   WBC 6.4 04/24/2012   HGB 14.9 04/24/2012   HCT 41.7 04/24/2012   MCV 89.9 04/24/2012   PLT 251 04/24/2012    Lab 04/25/12 0505 04/24/12 2021  NA 139 --  K 3.5 --  CL 101 --  CO2 28 --  BUN 25* --  CREATININE 0.99 --  CALCIUM 9.0 --  PROT -- 6.9  BILITOT -- 0.3  ALKPHOS -- 77  ALT -- 23  AST -- 22  GLUCOSE 96 --   Lab Results  Component Value Date   TROPONINI <0.30 04/25/2012    Lab Results  Component Value Date   CHOL 262* 04/25/2012   Lab Results  Component Value Date   HDL 39* 04/25/2012   Lab Results  Component Value Date   LDLCALC 177* 04/25/2012   Lab Results  Component Value Date   TRIG 229* 04/25/2012   Lab Results  Component Value Date   CHOLHDL 6.7 04/25/2012   No results found for this basename: LDLDIRECT      Radiology: no acute abnormality, COPD changes EKG: NSR, RBBB  FOLLOW UP PLANS AND APPOINTMENTS      Medication List     As of 04/25/2012  1:05 PM    TAKE these medications         aspirin 81 MG EC tablet   Take 1 tablet (81 mg total) by mouth daily.      digoxin 0.25 MG tablet   Commonly known as: LANOXIN   Take 0.25 mg by mouth daily.      diphenhydrAMINE 25 MG tablet   Commonly known as: BENADRYL   Take 25 mg by mouth every 6 (six) hours as needed. For seasonal allergies      simvastatin 40 MG tablet   Commonly known as: ZOCOR   Take 1 tablet (40 mg total) by mouth daily at 6 PM.      sotalol 80 MG tablet   Commonly known as: BETAPACE   Take 80 mg by mouth 2 (two) times daily.      triamterene-hydrochlorothiazide 37.5-25 MG per tablet   Commonly known as: MAXZIDE-25   Take 1 tablet by mouth daily.           Follow-up Information    Follow up with Corky Crafts., MD. In 4 days. (stress test)    Contact information:   [address] [place] [address]  [phone]          BRING ALL MEDICATIONS WITH YOU TO FOLLOW UP APPOINTMENTS  Time spent with patient to include physician time: 20 minutes  Signed: VARANASI,JAYADEEP S. 04/25/2012, 1:05 PM

## 2012-04-25 NOTE — Progress Notes (Signed)
Utilization review completed.  

## 2012-04-25 NOTE — Progress Notes (Signed)
Spoke with pt who stated he received Asprin before coming to the hospital yesterday.

## 2012-04-30 DIAGNOSIS — R079 Chest pain, unspecified: Secondary | ICD-10-CM | POA: Diagnosis not present

## 2012-04-30 DIAGNOSIS — I4891 Unspecified atrial fibrillation: Secondary | ICD-10-CM | POA: Diagnosis not present

## 2012-05-01 ENCOUNTER — Other Ambulatory Visit: Payer: Self-pay | Admitting: Interventional Cardiology

## 2012-05-02 ENCOUNTER — Encounter (HOSPITAL_COMMUNITY): Payer: Self-pay | Admitting: Family Medicine

## 2012-05-02 ENCOUNTER — Ambulatory Visit (HOSPITAL_COMMUNITY)
Admission: RE | Admit: 2012-05-02 | Discharge: 2012-05-03 | Disposition: A | Discharge status: Home / Self Care | Payer: Medicare Other | Source: Ambulatory Visit | Attending: Interventional Cardiology | Admitting: Interventional Cardiology

## 2012-05-02 ENCOUNTER — Encounter (HOSPITAL_BASED_OUTPATIENT_CLINIC_OR_DEPARTMENT_OTHER): Payer: Self-pay

## 2012-05-02 ENCOUNTER — Inpatient Hospital Stay (HOSPITAL_BASED_OUTPATIENT_CLINIC_OR_DEPARTMENT_OTHER)
Admission: RE | Admit: 2012-05-02 | Discharge: 2012-05-02 | Disposition: A | Discharge status: Hospice | Payer: Medicare Other | Source: Ambulatory Visit | Attending: Interventional Cardiology | Admitting: Interventional Cardiology

## 2012-05-02 ENCOUNTER — Encounter (HOSPITAL_COMMUNITY)
Admission: RE | Disposition: A | Discharge status: Home / Self Care | Payer: Self-pay | Source: Ambulatory Visit | Attending: Interventional Cardiology

## 2012-05-02 ENCOUNTER — Encounter (HOSPITAL_BASED_OUTPATIENT_CLINIC_OR_DEPARTMENT_OTHER)
Admission: RE | Disposition: A | Discharge status: Hospice | Payer: Self-pay | Source: Ambulatory Visit | Attending: Interventional Cardiology

## 2012-05-02 DIAGNOSIS — Z7982 Long term (current) use of aspirin: Secondary | ICD-10-CM | POA: Insufficient documentation

## 2012-05-02 DIAGNOSIS — E782 Mixed hyperlipidemia: Secondary | ICD-10-CM | POA: Diagnosis not present

## 2012-05-02 DIAGNOSIS — I2510 Atherosclerotic heart disease of native coronary artery without angina pectoris: Secondary | ICD-10-CM | POA: Insufficient documentation

## 2012-05-02 DIAGNOSIS — I4891 Unspecified atrial fibrillation: Secondary | ICD-10-CM | POA: Diagnosis not present

## 2012-05-02 DIAGNOSIS — Z79899 Other long term (current) drug therapy: Secondary | ICD-10-CM | POA: Insufficient documentation

## 2012-05-02 DIAGNOSIS — I200 Unstable angina: Secondary | ICD-10-CM | POA: Insufficient documentation

## 2012-05-02 DIAGNOSIS — E785 Hyperlipidemia, unspecified: Secondary | ICD-10-CM | POA: Insufficient documentation

## 2012-05-02 DIAGNOSIS — Z7902 Long term (current) use of antithrombotics/antiplatelets: Secondary | ICD-10-CM | POA: Diagnosis not present

## 2012-05-02 DIAGNOSIS — Z955 Presence of coronary angioplasty implant and graft: Secondary | ICD-10-CM

## 2012-05-02 DIAGNOSIS — R9430 Abnormal result of cardiovascular function study, unspecified: Secondary | ICD-10-CM | POA: Insufficient documentation

## 2012-05-02 HISTORY — DX: Abnormal result of cardiovascular function study, unspecified: R94.30

## 2012-05-02 HISTORY — DX: Unstable angina: I20.0

## 2012-05-02 HISTORY — PX: PERCUTANEOUS CORONARY STENT INTERVENTION (PCI-S): SHX5485

## 2012-05-02 SURGERY — PERCUTANEOUS CORONARY STENT INTERVENTION (PCI-S)
Anesthesia: LOCAL

## 2012-05-02 SURGERY — JV LEFT HEART CATHETERIZATION WITH CORONARY ANGIOGRAM
Anesthesia: Moderate Sedation

## 2012-05-02 MED ORDER — ASPIRIN 81 MG PO CHEW
CHEWABLE_TABLET | ORAL | Status: AC
  Filled 2012-05-02: qty 4

## 2012-05-02 MED ORDER — NITROGLYCERIN 1 MG/10 ML FOR IR/CATH LAB
INTRA_ARTERIAL | Status: AC
  Filled 2012-05-02: qty 10

## 2012-05-02 MED ORDER — ASPIRIN 81 MG PO CHEW
324.0000 mg | CHEWABLE_TABLET | Freq: Once | ORAL | Status: AC
  Administered 2012-05-02: 324 mg via ORAL

## 2012-05-02 MED ORDER — CLOPIDOGREL BISULFATE 75 MG PO TABS
75.0000 mg | ORAL_TABLET | Freq: Every day | ORAL | Status: DC
  Administered 2012-05-03: 08:00:00 75 mg via ORAL
  Filled 2012-05-02: qty 1

## 2012-05-02 MED ORDER — BIVALIRUDIN 250 MG IV SOLR
INTRAVENOUS | Status: AC
  Filled 2012-05-02: qty 250

## 2012-05-02 MED ORDER — ACETAMINOPHEN 325 MG PO TABS
650.0000 mg | ORAL_TABLET | ORAL | Status: DC | PRN
Start: 2012-05-02 — End: 2012-05-03

## 2012-05-02 MED ORDER — TICAGRELOR 90 MG PO TABS
ORAL_TABLET | ORAL | Status: AC
  Filled 2012-05-02: qty 2

## 2012-05-02 MED ORDER — TICAGRELOR 90 MG PO TABS
180.0000 mg | ORAL_TABLET | Freq: Once | ORAL | Status: AC
  Administered 2012-05-02: 180 mg via ORAL

## 2012-05-02 MED ORDER — SOTALOL HCL 80 MG PO TABS
80.0000 mg | ORAL_TABLET | Freq: Two times a day (BID) | ORAL | Status: DC
  Administered 2012-05-02: 22:00:00 80 mg via ORAL
  Filled 2012-05-02 (×3): qty 1

## 2012-05-02 MED ORDER — SODIUM CHLORIDE 0.9 % IV SOLN
INTRAVENOUS | Status: AC
  Administered 2012-05-02: 19:00:00 via INTRAVENOUS

## 2012-05-02 MED ORDER — FENTANYL CITRATE 0.05 MG/ML IJ SOLN
INTRAMUSCULAR | Status: AC
  Filled 2012-05-02: qty 2

## 2012-05-02 MED ORDER — TRIAMTERENE-HCTZ 37.5-25 MG PO TABS
1.0000 | ORAL_TABLET | Freq: Every day | ORAL | Status: DC
  Filled 2012-05-02 (×2): qty 1

## 2012-05-02 MED ORDER — LIDOCAINE HCL (PF) 1 % IJ SOLN
INTRAMUSCULAR | Status: AC
  Filled 2012-05-02: qty 30

## 2012-05-02 MED ORDER — ASPIRIN EC 81 MG PO TBEC
81.0000 mg | DELAYED_RELEASE_TABLET | Freq: Every day | ORAL | Status: DC
  Filled 2012-05-02 (×2): qty 1

## 2012-05-02 MED ORDER — MIDAZOLAM HCL 2 MG/2ML IJ SOLN
INTRAMUSCULAR | Status: AC
  Filled 2012-05-02: qty 2

## 2012-05-02 MED ORDER — SODIUM CHLORIDE 0.9 % IV SOLN
INTRAVENOUS | Status: DC
  Administered 2012-05-02: 09:00:00 via INTRAVENOUS

## 2012-05-02 MED ORDER — SIMVASTATIN 40 MG PO TABS
40.0000 mg | ORAL_TABLET | Freq: Every day | ORAL | Status: DC
  Administered 2012-05-02: 40 mg via ORAL
  Filled 2012-05-02 (×2): qty 1

## 2012-05-02 MED ORDER — HEPARIN (PORCINE) IN NACL 2-0.9 UNIT/ML-% IJ SOLN
INTRAMUSCULAR | Status: AC
  Filled 2012-05-02: qty 1000

## 2012-05-02 MED ORDER — DIGOXIN 250 MCG PO TABS
0.2500 mg | ORAL_TABLET | Freq: Every day | ORAL | Status: DC
  Filled 2012-05-02 (×2): qty 1

## 2012-05-02 MED ORDER — ONDANSETRON HCL 4 MG/2ML IJ SOLN: 4.0000 mg | Freq: Four times a day (QID) | INTRAMUSCULAR | Status: DC | PRN

## 2012-05-02 MED ORDER — DIPHENHYDRAMINE HCL 25 MG PO TABS
25.0000 mg | ORAL_TABLET | Freq: Four times a day (QID) | ORAL | Status: DC | PRN
  Filled 2012-05-02: qty 1

## 2012-05-02 NOTE — H&P (Signed)
  Date of Initial H&P: 04/24/12  History reviewed, patient examined, no change in status, stable for surgery. 

## 2012-05-02 NOTE — Progress Notes (Signed)
Allen's test performed on right hand with positive results, spo2 05 %.

## 2012-05-02 NOTE — CV Procedure (Signed)
PROCEDURE:  Left heart catheterization with selective coronary angiography, left ventriculogram.  INDICATIONS:  High risk stress test  The risks, benefits, and details of the procedure were explained to the patient.  The patient verbalized understanding and wanted to proceed.  Informed written consent was obtained.  PROCEDURE TECHNIQUE:  After Xylocaine anesthesia a 53F sheath was placed in the right femoral artery with a single anterior needle wall stick.   Left coronary angiography was done using a Judkins L4 guide catheter.  Right coronary angiography was done using a Judkins R4 guide catheter.  Left ventriculography was done using a pigtail catheter.    CONTRAST:  Total of 90 cc.  COMPLICATIONS:  None.    HEMODYNAMICS:  Aortic pressure was 116/64; LV pressure was 116/12; LVEDP 22.  There was no gradient between the left ventricle and aorta.    ANGIOGRAPHIC DATA:   The left main coronary artery is short but patent.  The left anterior descending artery is a large vessel.  In the proximal portion of this vessel, there is a severe 95% stenosis with TIMI 2 flow the remainder of the vessel.  The distal vessel shows competitive flow due to to right-to-left collaterals..  The left circumflex artery is a large dominant vessel.  This appears angiographically normal.  There is a very small ramus vessel which is patent.  The OM1 appears patent the OM 2 is widely patent the left PDA appears widely patent as well.  The right coronary artery is a small nondominant vessel.  There are collaterals to the distal LAD coming from the RCA.[name]  LEFT VENTRICULOGRAM:  Left ventricular angiogram was done in the 30 RAO projection and revealed mild apical and anterior hypokinesis with overall normal systolic function with an estimated ejection fraction of 55%.  LVEDP was 22 mmHg.  IMPRESSIONS:  1. Normal left main coronary artery. 2. Severe, 95% stenosis in the proximal left anterior descending artery.  The distal  vessel has competitive flow due to right to left collaterals. 3. Normal, large, dominant left circumflex artery with several obtuse marginal branches, all of which appear normal. 4. Small, nondominant right coronary artery which is widely patent. 5. Overall, Normal left ventricular systolic function.  LVEDP 22 mmHg.  Ejection fraction 55 %.  RECOMMENDATION:  Will bring the patient upstairs to the inpatient cath lab.  We'll plan for PCI of the LAD.  No contraindication to drug-eluting stent.  He may be enrolled in a research study if he is agreeable.  He will need secondary prevention after this with LDL target being less than 100.

## 2012-05-02 NOTE — Progress Notes (Signed)
Utilization Review Completed Livianna Petraglia J. Laurieann Friddle, RN, BSN, NCM [phone]  

## 2012-05-02 NOTE — CV Procedure (Signed)
PROCEDURE:  PCI LAD  INDICATIONS:  Unstable angina, abnormal stress test  The risks, benefits, and details of the procedure were explained to the patient.  The patient verbalized understanding and wanted to proceed.  Informed written consent was obtained.  PROCEDURE TECHNIQUE: The 48F sheath changed to a 6Fr sheath in the right femoral artery.   Left coronary angiography was done using a CLS 4.0 guide catheter.      CONTRAST:  Total of 75 cc.  COMPLICATIONS:  None.    HEMODYNAMICS:  Aortic pressure was 134/76  PCI NARRATIVE:    A CLS 4 guiding catheter was placed into the short left main.  It did favor the circumflex artery.  After being slightly disengaged, the pro-water wire was directed into the LAD and across the area of disease in the proximal LAD.  The diagnostic catheterization had shown a 95% proximal LAD lesion.  A 2.5 x 15 emerge balloon was used to predilate the lesion.  A 3.5 x 18 expedition drug-eluting stent was deployed at 12 atmospheres.  The stent was postdilated with a 3.75 x 12 noncompliant balloon inflated twice, up to 16 atmospheres.  There is a an excellent angiographic result.  There is no residual stenosis.   IMPRESSIONS:  1.  Successful drug-eluting stent placement to the 95% stenosis in the proximal LAD with a 3.5 x 18 expedition drug-eluting stent, postdilated to 3.8 mm in diameter.  RECOMMENDATION:  The patient will need dual antiplatelet therapy for at least a year. He'll also need aggressive secondary prevention.

## 2012-05-02 NOTE — Progress Notes (Signed)
Site area: right groin  Site Prior to Removal:  Level 0  Pressure Applied For 25 MINUTES    Minutes Beginning at 1637  Manual:   yes  Patient Status During Pull:  AAO X3  Post Pull Groin Site:  Level 0  Post Pull Instructions Given:  yes  Post Pull Pulses Present:  yes  Dressing Applied:  yes  Comments:  Tolerated procedure well

## 2012-05-03 DIAGNOSIS — I2510 Atherosclerotic heart disease of native coronary artery without angina pectoris: Secondary | ICD-10-CM | POA: Diagnosis not present

## 2012-05-03 DIAGNOSIS — I4891 Unspecified atrial fibrillation: Secondary | ICD-10-CM | POA: Diagnosis not present

## 2012-05-03 DIAGNOSIS — E782 Mixed hyperlipidemia: Secondary | ICD-10-CM | POA: Diagnosis not present

## 2012-05-03 DIAGNOSIS — Z7902 Long term (current) use of antithrombotics/antiplatelets: Secondary | ICD-10-CM | POA: Diagnosis not present

## 2012-05-03 DIAGNOSIS — I200 Unstable angina: Secondary | ICD-10-CM | POA: Diagnosis not present

## 2012-05-03 DIAGNOSIS — R9430 Abnormal result of cardiovascular function study, unspecified: Secondary | ICD-10-CM | POA: Diagnosis not present

## 2012-05-03 DIAGNOSIS — E785 Hyperlipidemia, unspecified: Secondary | ICD-10-CM | POA: Diagnosis not present

## 2012-05-03 DIAGNOSIS — Z79899 Other long term (current) drug therapy: Secondary | ICD-10-CM | POA: Diagnosis not present

## 2012-05-03 LAB — BASIC METABOLIC PANEL
BUN: 17 mg/dL (ref 6–23)
CO2: 27 mEq/L (ref 19–32)
Calcium: 8.8 mg/dL (ref 8.4–10.5)
GFR calc non Af Amer: 82 mL/min — ABNORMAL LOW (ref >90)
Glucose, Bld: 98 mg/dL (ref 70–99)
Sodium: 140 mEq/L (ref 135–145)

## 2012-05-03 LAB — CBC
HCT: 38.9 % — ABNORMAL LOW (ref 39.0–52.0)
Hemoglobin: 13.5 g/dL (ref 13.0–17.0)
MCH: 31.2 pg (ref 26.0–34.0)
MCHC: 34.7 g/dL (ref 30.0–36.0)
RBC: 4.33 MIL/uL (ref 4.22–5.81)

## 2012-05-03 MED ORDER — CLOPIDOGREL BISULFATE 75 MG PO TABS: 75.0000 mg | ORAL_TABLET | Freq: Every day | ORAL | Status: DC

## 2012-05-03 NOTE — Discharge Summary (Signed)
Patient ID: CYAN MOULTRIE MRN: [id number] DOB/AGE: 05-14-1940 72 y.o.  Admit date: 05/02/2012 Discharge date: 05/03/2012  Primary Discharge Diagnosis Unstable angina Secondary Discharge DiagnosisAbnormal stress test, CAD, AFib, hyperlipidemia  Significant Diagnostic Studies: angiography: cardiac cath with 95% proximal LAD stenosis, successfully treated with a 3.5 x 18 Expedition drug eluting stent, post dilated to 3.8 mm in diamter.  Consults: None  Hospital Course: 72 y/o man who had some chest and arm pain.  He had a high risk stress test and was referred for cath. Cath revealed the above findings.  Prior to the cath in the holding area, he had his chest and arm pain at rest, although it was not severe.  He tolerated cath and PCI well.  He had no bleeding problems post procedure.  No pain with walking with cardiac rehab the next day.  He was anxious to go home as well.  Statin had been started a few weeks earlier for increased cholesterol.    He would take Brilinta 90 mg PO BID for the first month after the stent.  Then, he will switch to clopidogrel 75 mg daily for cost reasons.  Hand written Rx and instructions given to him along with discount card.   Discharge Exam: Blood pressure 123/74, pulse 71, temperature 98.5 F (36.9 C), temperature source Oral, resp. rate 17, height 6\' 2"  (1.88 m), weight 67.8 kg (149 lb 7.6 oz), SpO2 95.00%.   [place]/AT RRR S1S2 No wheezing Soft NT ND No edema No right groin hematoma   Labs:   Lab Results  Component Value Date   WBC 10.2 05/03/2012   HGB 13.5 05/03/2012   HCT 38.9* 05/03/2012   MCV 89.8 05/03/2012   PLT 219 05/03/2012    Lab 05/03/12 0600  NA 140  K 3.5  CL 104  CO2 27  BUN 17  CREATININE 0.93  CALCIUM 8.8  PROT --  BILITOT --  ALKPHOS --  ALT --  AST --  GLUCOSE 98   Lab Results  Component Value Date   TROPONINI <0.30 04/25/2012    Lab Results  Component Value Date   CHOL 262* 04/25/2012   Lab Results  Component Value  Date   HDL 39* 04/25/2012   Lab Results  Component Value Date   LDLCALC 177* 04/25/2012   Lab Results  Component Value Date   TRIG 229* 04/25/2012   Lab Results  Component Value Date   CHOLHDL 6.7 04/25/2012   No results found for this basename: LDLDIRECT       EKG: NSR, RBBB, TWI anteriorly  FOLLOW UP PLANS AND APPOINTMENTS    Medication List     As of 05/03/2012  9:37 AM    TAKE these medications         aspirin 81 MG EC tablet   Take 1 tablet (81 mg total) by mouth daily.      clopidogrel 75 MG tablet   Commonly known as: PLAVIX   Take 1 tablet (75 mg total) by mouth daily with breakfast.      digoxin 0.25 MG tablet   Commonly known as: LANOXIN   Take 0.25 mg by mouth daily.      diphenhydrAMINE 25 MG tablet   Commonly known as: BENADRYL   Take 25 mg by mouth every 6 (six) hours as needed. For seasonal allergies      simvastatin 40 MG tablet   Commonly known as: ZOCOR   Take 1 tablet (40 mg total) by mouth daily  at 6 PM.      sotalol 80 MG tablet   Commonly known as: BETAPACE   Take 80 mg by mouth 2 (two) times daily.      triamterene-hydrochlorothiazide 37.5-25 MG per tablet   Commonly known as: MAXZIDE-25   Take 1 tablet by mouth daily.           Follow-up Information    Follow up with Corky Crafts., MD. Schedule an appointment as soon as possible for a visit in 3 weeks.   Contact information:   [address] [place] [address] [phone]          BRING ALL MEDICATIONS WITH YOU TO FOLLOW UP APPOINTMENTS  Time spent with patient to include physician time:25 minutes Signed: Karrina Lye S. 05/03/2012, 9:37 AM

## 2012-05-03 NOTE — Progress Notes (Signed)
CARDIAC REHAB PHASE I   PRE:  Rate/Rhythm: 75 SR  BP:  Supine: 118/77  Sitting:   Standing:    SaO2: 96% RA  MODE:  Ambulation: 600 ft   POST:  Rate/Rhythm: 86 SR  BP:  Supine:   Sitting: 125/77  Standing:    SaO2: 98% RA  7829-5621- Pt tolerated ambulation well with assist x1, no c/o, VSS. PCI education completed with pt and pt's wife. Discussed Phase 2 CR and referral to program in here at [facility]. Pt verbalizes understanding of instructions given.  Annetta Maw

## 2012-05-05 LAB — GLUCOSE, CAPILLARY: Glucose-Capillary: 84 mg/dL (ref 70–99)

## 2012-05-05 MED FILL — Dextrose Inj 5%: INTRAVENOUS | Qty: 50 | Status: AC

## 2012-05-17 ENCOUNTER — Other Ambulatory Visit: Payer: Self-pay

## 2012-05-19 DIAGNOSIS — I2510 Atherosclerotic heart disease of native coronary artery without angina pectoris: Secondary | ICD-10-CM | POA: Diagnosis not present

## 2012-05-19 DIAGNOSIS — E782 Mixed hyperlipidemia: Secondary | ICD-10-CM | POA: Diagnosis not present

## 2012-05-19 DIAGNOSIS — I4891 Unspecified atrial fibrillation: Secondary | ICD-10-CM | POA: Diagnosis not present

## 2012-07-17 DIAGNOSIS — E782 Mixed hyperlipidemia: Secondary | ICD-10-CM | POA: Diagnosis not present

## 2012-08-21 DIAGNOSIS — I4891 Unspecified atrial fibrillation: Secondary | ICD-10-CM | POA: Diagnosis not present

## 2012-08-21 DIAGNOSIS — I2510 Atherosclerotic heart disease of native coronary artery without angina pectoris: Secondary | ICD-10-CM | POA: Diagnosis not present

## 2012-11-05 ENCOUNTER — Other Ambulatory Visit: Payer: Self-pay

## 2012-12-04 DIAGNOSIS — R197 Diarrhea, unspecified: Secondary | ICD-10-CM | POA: Diagnosis not present

## 2012-12-30 DIAGNOSIS — I4892 Unspecified atrial flutter: Secondary | ICD-10-CM | POA: Diagnosis not present

## 2012-12-30 DIAGNOSIS — I2510 Atherosclerotic heart disease of native coronary artery without angina pectoris: Secondary | ICD-10-CM | POA: Diagnosis not present

## 2012-12-30 DIAGNOSIS — Z86010 Personal history of colonic polyps: Secondary | ICD-10-CM | POA: Diagnosis not present

## 2012-12-30 DIAGNOSIS — R197 Diarrhea, unspecified: Secondary | ICD-10-CM | POA: Diagnosis not present

## 2012-12-31 DIAGNOSIS — Z85828 Personal history of other malignant neoplasm of skin: Secondary | ICD-10-CM | POA: Diagnosis not present

## 2012-12-31 DIAGNOSIS — D239 Other benign neoplasm of skin, unspecified: Secondary | ICD-10-CM | POA: Diagnosis not present

## 2012-12-31 DIAGNOSIS — L719 Rosacea, unspecified: Secondary | ICD-10-CM | POA: Diagnosis not present

## 2012-12-31 DIAGNOSIS — L821 Other seborrheic keratosis: Secondary | ICD-10-CM | POA: Diagnosis not present

## 2013-01-16 DIAGNOSIS — Z23 Encounter for immunization: Secondary | ICD-10-CM | POA: Diagnosis not present

## 2013-02-05 ENCOUNTER — Other Ambulatory Visit: Payer: Self-pay

## 2013-02-18 ENCOUNTER — Encounter: Payer: Self-pay | Admitting: Interventional Cardiology

## 2013-02-19 ENCOUNTER — Encounter: Payer: Self-pay | Admitting: Interventional Cardiology

## 2013-02-19 ENCOUNTER — Ambulatory Visit (INDEPENDENT_AMBULATORY_CARE_PROVIDER_SITE_OTHER): Payer: Medicare Other | Admitting: Interventional Cardiology

## 2013-02-19 VITALS — BP 122/70 | HR 60 | Ht 74.0 in | Wt 139.0 lb

## 2013-02-19 DIAGNOSIS — E782 Mixed hyperlipidemia: Secondary | ICD-10-CM

## 2013-02-19 DIAGNOSIS — I2510 Atherosclerotic heart disease of native coronary artery without angina pectoris: Secondary | ICD-10-CM | POA: Diagnosis not present

## 2013-02-19 DIAGNOSIS — I4891 Unspecified atrial fibrillation: Secondary | ICD-10-CM

## 2013-02-19 MED ORDER — TRIAMTERENE-HCTZ 37.5-25 MG PO TABS: 1.0000 | ORAL_TABLET | Freq: Every day | ORAL | Status: DC

## 2013-02-19 MED ORDER — DIGOXIN 250 MCG PO TABS: 0.2500 mg | ORAL_TABLET | Freq: Every day | ORAL | Status: DC

## 2013-02-19 MED ORDER — SOTALOL HCL 80 MG PO TABS: 80.0000 mg | ORAL_TABLET | Freq: Two times a day (BID) | ORAL | Status: DC

## 2013-02-19 NOTE — Progress Notes (Signed)
Patient ID: Vincent Hampton, male   DOB: 04/07/1940, 72 y.o.   MRN: [id number]    [address] [place], [place]  [address] Phone: [phone] Fax:  [phone]  Date:  02/19/2013   ID:  Vincent Hampton, DOB 11-Mar-1941, MRN [id number]  PCP:  No primary provider on file.      History of Present Illness: Vincent Hampton is a 72 y.o. male who has had PAF. He had a prox LAD stent for unstable angina sx. His anginal equivalent was left arm pain. This has resolved post stent. CAD/ASCVD:  c/o Leg edema transient, when on his feet all day and of the medicine.  Denies : Chest pain.  Diaphoresis.  Dyspnea on exertion.  Fatigue.  Nitroglycerin.  Orthopnea.  Palpitations.  Paroxysmal nocturnal dyspnea.     Wt Readings from Last 3 Encounters:  02/19/13 139 lb (63.05 kg)  05/03/12 149 lb 7.6 oz (67.8 kg)  05/02/12 141 lb (63.957 kg)     Past Medical History  Diagnosis Date  . Atrial fibrillation   . Hypertension   . Prostate cancer   . Chest pain, unspecified   . Mixed hyperlipidemia   . Nonspecific abnormal unspecified cardiovascular function study   . Intermediate coronary syndrome     Current Outpatient Prescriptions  Medication Sig Dispense Refill  . aspirin EC 81 MG EC tablet Take 1 tablet (81 mg total) by mouth daily.      . clopidogrel (PLAVIX) 75 MG tablet Take 1 tablet (75 mg total) by mouth daily with breakfast.      . digoxin (LANOXIN) 0.25 MG tablet Take 0.25 mg by mouth daily.      . diphenhydrAMINE (BENADRYL) 25 MG tablet Take 25 mg by mouth every 6 (six) hours as needed. For seasonal allergies      . simvastatin (ZOCOR) 40 MG tablet Take 1 tablet (40 mg total) by mouth daily at 6 PM.  30 tablet  11  . sotalol (BETAPACE) 80 MG tablet Take 80 mg by mouth 2 (two) times daily.      [name] triamterene-hydrochlorothiazide (MAXZIDE-25) 37.5-25 MG per tablet Take 1 tablet by mouth daily.      [name] NITROSTAT 0.4 MG SL tablet As directed       No current  facility-administered medications for this visit.    Allergies:   No Known Allergies  Social History:  The patient  reports that he has quit smoking. He does not have any smokeless tobacco history on file. He reports that he does not drink alcohol or use illicit drugs.   Family History:  The patient's family history is not on file.   ROS:  Please see the history of present illness.  No nausea, vomiting.  No fevers, chills.  No focal weakness.  No dysuria.    All other systems reviewed and negative.   PHYSICAL EXAM: VS:  BP 122/70  Pulse 60  Ht 6\' 2"  (1.88 m)  Wt 139 lb (63.05 kg)  BMI 17.84 kg/m2 Well nourished, well developed, in no acute distress HEENT: normal Neck: no JVD, no carotid bruits Cardiac:  normal S1, S2; RRR;  Lungs:  clear to auscultation bilaterally, no wheezing, rhonchi or rales Abd: soft, nontender, no hepatomegaly Ext: no edema, 2+ PT pulses bilaterally Skin: warm and dry Neuro:   no focal abnormalities noted     ASSESSMENT AND PLAN:  Atrial fibrillation  Continue Sotalol HCl Tablet, 80 MG, 1 tablet, Orally, Twice a  day Notes: No sx of AFib. No coumadin given dual antiplatelet therapy. Discussed antiinflammatory. OK to use occasionally. he does not take them regularly.  2. CAD in native artery  Continue Aspirin EC Low Dose Tablet Delayed Release, 81 MG, 1 tablet, Orally, Once a day Start Plavix Tablet, 75 MG, 1 tablet, Orally, Once a day, 90 days, 90, Refills 3 Notes: Declined cardiac rehab. Plavix less expensive than Brilinta.  3. Hyperlipidemia: LDL now controlled on Simvastatin.  LDL 90 in  4/14. significantly better compared to January of 2014.    Signed, Fredric Mare, MD, [facility] 02/19/2013 10:45 AM

## 2013-02-19 NOTE — Patient Instructions (Signed)
Your physician recommends that you continue on your current medications as directed. Please refer to the Current Medication list given to you today.  Your physician wants you to follow-up in: 6 months with Dr. Eldridge Dace. You will receive a reminder letter in the mail two months in advance. If you don't receive a letter, please call our office to schedule the follow-up appointment.  I refilled your Maxzide, Betapace and Lanoxin to [facility] for 90 day supply's.

## 2013-02-23 ENCOUNTER — Telehealth: Payer: Self-pay | Admitting: Interventional Cardiology

## 2013-02-23 MED ORDER — DIGOXIN 250 MCG PO TABS: 0.2500 mg | ORAL_TABLET | Freq: Every day | ORAL | Status: DC

## 2013-02-23 NOTE — Telephone Encounter (Signed)
New message     Waiting on mail order to send medications but pt is out of meds.  Need some pills sent to pharmacy to last until mail order presc comes.  Need digoxin called in to [facility] at [address].

## 2013-02-23 NOTE — Telephone Encounter (Signed)
Refilled to local pharmacy ([facility]). Pt notified.

## 2013-02-23 NOTE — Telephone Encounter (Signed)
Follow up     Pt went to pharmacy to get digoxin---it has not been called in.  He is out of medication and need to get it asap if possible.

## 2013-03-18 DIAGNOSIS — M779 Enthesopathy, unspecified: Secondary | ICD-10-CM | POA: Diagnosis not present

## 2013-03-23 DIAGNOSIS — M779 Enthesopathy, unspecified: Secondary | ICD-10-CM | POA: Diagnosis not present

## 2013-04-01 DIAGNOSIS — M779 Enthesopathy, unspecified: Secondary | ICD-10-CM | POA: Diagnosis not present

## 2013-04-06 ENCOUNTER — Telehealth: Payer: Self-pay | Admitting: Interventional Cardiology

## 2013-04-06 NOTE — Telephone Encounter (Signed)
Received request from Nurse fax box, documents faxed for surgical clearance. To: [facility] Fax number: [phone] Attention: 04/06/13/km

## 2013-04-07 DIAGNOSIS — H2510 Age-related nuclear cataract, unspecified eye: Secondary | ICD-10-CM | POA: Diagnosis not present

## 2013-04-08 DIAGNOSIS — M779 Enthesopathy, unspecified: Secondary | ICD-10-CM | POA: Diagnosis not present

## 2013-04-10 DIAGNOSIS — H02409 Unspecified ptosis of unspecified eyelid: Secondary | ICD-10-CM | POA: Diagnosis not present

## 2013-04-10 DIAGNOSIS — H2589 Other age-related cataract: Secondary | ICD-10-CM | POA: Diagnosis not present

## 2013-04-10 DIAGNOSIS — H04129 Dry eye syndrome of unspecified lacrimal gland: Secondary | ICD-10-CM | POA: Diagnosis not present

## 2013-04-14 DIAGNOSIS — H2510 Age-related nuclear cataract, unspecified eye: Secondary | ICD-10-CM | POA: Diagnosis not present

## 2013-04-20 DIAGNOSIS — H2589 Other age-related cataract: Secondary | ICD-10-CM | POA: Diagnosis not present

## 2013-04-27 DIAGNOSIS — H25049 Posterior subcapsular polar age-related cataract, unspecified eye: Secondary | ICD-10-CM | POA: Diagnosis not present

## 2013-04-27 DIAGNOSIS — H2510 Age-related nuclear cataract, unspecified eye: Secondary | ICD-10-CM | POA: Diagnosis not present

## 2013-04-27 DIAGNOSIS — H2589 Other age-related cataract: Secondary | ICD-10-CM | POA: Diagnosis not present

## 2013-04-29 ENCOUNTER — Other Ambulatory Visit: Payer: Self-pay

## 2013-04-29 MED ORDER — CLOPIDOGREL BISULFATE 75 MG PO TABS: 75.0000 mg | ORAL_TABLET | Freq: Every day | ORAL | Status: DC

## 2013-04-29 MED ORDER — SOTALOL HCL 80 MG PO TABS: 80.0000 mg | ORAL_TABLET | Freq: Two times a day (BID) | ORAL | Status: DC

## 2013-04-29 MED ORDER — TRIAMTERENE-HCTZ 37.5-25 MG PO TABS: 1.0000 | ORAL_TABLET | Freq: Every day | ORAL | Status: DC

## 2013-04-29 MED ORDER — SIMVASTATIN 40 MG PO TABS: 40.0000 mg | ORAL_TABLET | Freq: Every day | ORAL | Status: DC

## 2013-04-29 MED ORDER — DIGOXIN 250 MCG PO TABS: 0.2500 mg | ORAL_TABLET | Freq: Every day | ORAL | Status: DC

## 2013-04-30 ENCOUNTER — Telehealth: Payer: Self-pay | Admitting: *Deleted

## 2013-04-30 NOTE — Telephone Encounter (Signed)
PA to [facility] for digoxin

## 2013-05-07 DIAGNOSIS — K648 Other hemorrhoids: Secondary | ICD-10-CM | POA: Diagnosis not present

## 2013-05-07 DIAGNOSIS — Z86010 Personal history of colonic polyps: Secondary | ICD-10-CM | POA: Diagnosis not present

## 2013-05-07 DIAGNOSIS — Z09 Encounter for follow-up examination after completed treatment for conditions other than malignant neoplasm: Secondary | ICD-10-CM | POA: Diagnosis not present

## 2013-05-07 DIAGNOSIS — K5730 Diverticulosis of large intestine without perforation or abscess without bleeding: Secondary | ICD-10-CM | POA: Diagnosis not present

## 2013-05-11 DIAGNOSIS — H25049 Posterior subcapsular polar age-related cataract, unspecified eye: Secondary | ICD-10-CM | POA: Diagnosis not present

## 2013-05-11 DIAGNOSIS — H2510 Age-related nuclear cataract, unspecified eye: Secondary | ICD-10-CM | POA: Diagnosis not present

## 2013-06-08 ENCOUNTER — Encounter: Payer: Self-pay | Admitting: Interventional Cardiology

## 2013-07-17 ENCOUNTER — Ambulatory Visit (INDEPENDENT_AMBULATORY_CARE_PROVIDER_SITE_OTHER): Payer: Medicare Other | Admitting: *Deleted

## 2013-07-17 DIAGNOSIS — H04129 Dry eye syndrome of unspecified lacrimal gland: Secondary | ICD-10-CM | POA: Diagnosis not present

## 2013-07-17 DIAGNOSIS — I4891 Unspecified atrial fibrillation: Secondary | ICD-10-CM

## 2013-07-17 DIAGNOSIS — I10 Essential (primary) hypertension: Secondary | ICD-10-CM

## 2013-07-17 DIAGNOSIS — E782 Mixed hyperlipidemia: Secondary | ICD-10-CM

## 2013-07-17 LAB — LIPID PANEL
CHOL/HDL RATIO: 3
Cholesterol: 157 mg/dL (ref 0–200)
HDL: 45.3 mg/dL (ref >39.00)
LDL Cholesterol: 90 mg/dL (ref 0–99)
TRIGLYCERIDES: 107 mg/dL (ref 0.0–149.0)
VLDL: 21.4 mg/dL (ref 0.0–40.0)

## 2013-07-17 LAB — HEPATIC FUNCTION PANEL
ALBUMIN: 3.6 g/dL (ref 3.5–5.2)
ALK PHOS: 67 U/L (ref 39–117)
ALT: 23 U/L (ref 0–53)
AST: 21 U/L (ref 0–37)
BILIRUBIN DIRECT: 0 mg/dL (ref 0.0–0.3)
Total Bilirubin: 0.6 mg/dL (ref 0.3–1.2)
Total Protein: 6.6 g/dL (ref 6.0–8.3)

## 2013-07-23 ENCOUNTER — Other Ambulatory Visit: Payer: Self-pay | Admitting: Cardiology

## 2013-07-23 NOTE — Telephone Encounter (Signed)
Created in Error

## 2013-07-24 ENCOUNTER — Other Ambulatory Visit: Payer: Self-pay | Admitting: Cardiology

## 2013-07-24 DIAGNOSIS — E782 Mixed hyperlipidemia: Secondary | ICD-10-CM

## 2013-08-31 ENCOUNTER — Ambulatory Visit (INDEPENDENT_AMBULATORY_CARE_PROVIDER_SITE_OTHER): Payer: Medicare Other | Admitting: Interventional Cardiology

## 2013-08-31 ENCOUNTER — Encounter: Payer: Self-pay | Admitting: Interventional Cardiology

## 2013-08-31 VITALS — BP 118/78 | HR 54 | Ht 73.0 in | Wt 139.0 lb

## 2013-08-31 DIAGNOSIS — R5383 Other fatigue: Secondary | ICD-10-CM

## 2013-08-31 DIAGNOSIS — I2510 Atherosclerotic heart disease of native coronary artery without angina pectoris: Secondary | ICD-10-CM | POA: Diagnosis not present

## 2013-08-31 DIAGNOSIS — I4891 Unspecified atrial fibrillation: Secondary | ICD-10-CM

## 2013-08-31 DIAGNOSIS — E782 Mixed hyperlipidemia: Secondary | ICD-10-CM | POA: Diagnosis not present

## 2013-08-31 DIAGNOSIS — I10 Essential (primary) hypertension: Secondary | ICD-10-CM | POA: Diagnosis not present

## 2013-08-31 DIAGNOSIS — R5381 Other malaise: Secondary | ICD-10-CM

## 2013-08-31 MED ORDER — DIGOXIN 250 MCG PO TABS: ORAL_TABLET | ORAL | Status: DC

## 2013-08-31 NOTE — Patient Instructions (Signed)
Your physician has recommended you make the following change in your medication:   1. Decrease Digoxin 0.25 to 1/2 tablet daily for 1 week and then stop medication.  Your physician wants you to follow-up in: 1 year with Dr. Irish Lack. You will receive a reminder letter in the mail two months in advance. If you don't receive a letter, please call our office to schedule the follow-up appointment.

## 2013-08-31 NOTE — Progress Notes (Signed)
Patient ID: Vincent Hampton, male   DOB: 03-02-1941, 73 y.o.   MRN: [id number]    Vincent Hampton, [address] [place], [place]  [address] Phone: [phone] Fax:  [phone]  Date:  08/31/2013   ID:  Vincent Hampton, DOB 1941/03/11, MRN [id number]  PCP:  Vincent Pac, MD      History of Present Illness: Vincent Hampton is a 73 y.o. male who has had PAF. He had a prox LAD stent for unstable angina sx in Jan 2014. His anginal equivalent was left arm pain. This has resolved post stent. CAD/ASCVD:  c/o Leg edema transient, when on his feet all day and of the medicine.  Denies : Chest pain.  Diaphoresis.  Dyspnea on exertion.  Fatigue.  Nitroglycerin.  Orthopnea.  Palpitations.  Paroxysmal nocturnal dyspnea.    Is doing well. Denies chest pain, SHOB, PND, palpitations, irregular heart beats, LE edema. No bleeding, but has some nuisance bruising. Walks regularly, 4000 steps daily. Climbs ladders daily for work w/o [employer] and chest pain. BP is well-controlled, SBP usually around 120s whenever he got it checked at his dr. appt. Does not check BPs at home.     Wt Readings from Last 3 Encounters:  08/31/13 139 lb (63.05 kg)  02/19/13 139 lb (63.05 kg)  05/03/12 149 lb 7.6 oz (67.8 kg)     Past Medical History  Diagnosis Date  . Atrial fibrillation   . Hypertension   . Prostate cancer   . Chest pain, unspecified   . Mixed hyperlipidemia   . Nonspecific abnormal unspecified cardiovascular function study   . Intermediate coronary syndrome     Current Outpatient Prescriptions  Medication Sig Dispense Refill  . aspirin EC 81 MG EC tablet Take 1 tablet (81 mg total) by mouth daily.      . clopidogrel (PLAVIX) 75 MG tablet Take 1 tablet (75 mg total) by mouth daily with breakfast.  90 tablet  3  . digoxin (LANOXIN) 0.25 MG tablet Take 1 tablet (0.25 mg total) by mouth daily.  90 tablet  3  . diphenhydrAMINE (BENADRYL) 25 MG tablet Take 25 mg by mouth every 6 (six) hours as  needed. For seasonal allergies      . NITROSTAT 0.4 MG SL tablet As directed      . simvastatin (ZOCOR) 40 MG tablet Take 1 tablet (40 mg total) by mouth daily at 6 PM.  90 tablet  3  . sotalol (BETAPACE) 80 MG tablet Take 1 tablet (80 mg total) by mouth 2 (two) times daily.  180 tablet  2  . triamterene-hydrochlorothiazide (MAXZIDE-25) 37.5-25 MG per tablet Take 1 tablet by mouth daily.  90 tablet  2   No current facility-administered medications for this visit.    Allergies:   No Known Allergies  Social History:  The patient  reports that he has quit smoking. He does not have any smokeless tobacco history on file. He reports that he does not drink alcohol or use illicit drugs.   Family History:  The patient's family history includes Alzheimer's disease in his mother; Cancer in his father; Lung cancer in his sister.   ROS:  Please see the history of present illness.  No nausea, vomiting.  No fevers, chills.  No focal weakness.  No dysuria. Occasional fatigue.  All other systems reviewed and negative.   PHYSICAL EXAM: VS:  BP 118/78  Pulse 54  Ht 6\' 1"  (1.854 m)  Wt 139 lb (63.05 kg)  BMI 18.34 kg/m2 Well nourished, well developed, in no acute distress HEENT: normal Neck: no JVD, no carotid bruits Cardiac:  normal S1, S2; RRR;  Lungs:  clear to auscultation bilaterally, no wheezing, rhonchi or rales Abd: soft, nontender, no hepatomegaly Ext: no edema Skin: warm and dry Neuro:   no focal abnormalities noted  EKG:  Sinus bradycardia, right bundle branch block, prolonged PR interval    ASSESSMENT AND PLAN:  Atrial fibrillation  Continue Sotalol HCl Tablet, 80 MG, 1 tablet, Orally, Twice a day Notes: No sx of AFib  he is agreeable to continue with dual antiplatelet therapy, rather than go on any anticoagulation for atrial fibrillation.  2. CAD in native artery  Continue Aspirin EC Low Dose Tablet Delayed Release, 81 MG, 1 tablet, Orally, Once a day  continue Plavix Tablet, 75  MG, 1 tablet, Orally, Once a day, 90 days, 90, Refills 3 No angina.  3. fatigue: He feels little more tired than usual. He is noted to be bradycardic on exam. 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 Given that he is on sotalol and digoxin, this may be too much for his conduction system. Will have him stop the digoxin at this time. He will cut the dose in half for a week and then stop it altogether. Hopefully, his heart rate will be a little bit higher and he will have a little bit more energy. I've asked him to check his blood pressure at home to also see what his heart rate is doing off of the digoxin.   4. hyperlipidemia: Lipids well controlled in April with LDL of 90. Continue simvastatin.  5. hypertension: Continue Maxzide. Blood pressure well controlled.     Signed, Mina Marble, MD, [facility] 08/31/2013 9:26 AM

## 2013-11-04 ENCOUNTER — Ambulatory Visit: Payer: Medicare Other | Admitting: Cardiology

## 2013-11-04 ENCOUNTER — Encounter: Payer: Self-pay | Admitting: Interventional Cardiology

## 2013-11-04 ENCOUNTER — Ambulatory Visit (INDEPENDENT_AMBULATORY_CARE_PROVIDER_SITE_OTHER): Payer: Medicare Other | Admitting: Interventional Cardiology

## 2013-11-04 ENCOUNTER — Telehealth: Payer: Self-pay | Admitting: Interventional Cardiology

## 2013-11-04 VITALS — BP 110/72 | HR 75 | Ht 74.0 in | Wt 143.0 lb

## 2013-11-04 DIAGNOSIS — I10 Essential (primary) hypertension: Secondary | ICD-10-CM

## 2013-11-04 DIAGNOSIS — I4891 Unspecified atrial fibrillation: Secondary | ICD-10-CM

## 2013-11-04 DIAGNOSIS — I2510 Atherosclerotic heart disease of native coronary artery without angina pectoris: Secondary | ICD-10-CM | POA: Diagnosis not present

## 2013-11-04 DIAGNOSIS — I480 Paroxysmal atrial fibrillation: Secondary | ICD-10-CM

## 2013-11-04 MED ORDER — METOPROLOL TARTRATE 25 MG PO TABS
12.5000 mg | ORAL_TABLET | Freq: Two times a day (BID) | ORAL | Status: DC
Start: 2013-11-04 — End: 2014-10-27

## 2013-11-04 MED ORDER — APIXABAN 5 MG PO TABS: 5.0000 mg | ORAL_TABLET | Freq: Two times a day (BID) | ORAL | Status: DC

## 2013-11-04 NOTE — Telephone Encounter (Signed)
Follow up  ° ° °Patient calling back to speak with nurse  °

## 2013-11-04 NOTE — Telephone Encounter (Signed)
°  Patient had a episode with AFIB last night and would like to speak with you regarding it and medications. Please call and advise.

## 2013-11-04 NOTE — Patient Instructions (Signed)
Your physician has recommended you make the following change in your medication:   1. Stop Aspirin and Plavix.  2. Start Eliquis 5 mg 1 tablet twice a day.  3. Start Metoprolol Tartrate 25 mg 1/2 table twice a day.  Your physician recommends that you return for lab work on 12/14/13 for cbc and bmet.   Your physician recommends that you schedule a follow-up appointment in: 4 months with Dr. Irish Lack.   Your wife can schedule a New Patient appointment with Dr. Irish Lack at her convenience.

## 2013-11-04 NOTE — Telephone Encounter (Addendum)
Spoke with pt and the episode he had last night was the first episode he has had in a while. Pt states he started feeling "strange" around 7 pm. At 10 pm he started to have blurred vision and slight dizziness/lightheadedness. He checked his BP and it was 105/70's and then it dropped to 85/65. HR was erratic between 50-80 bpm and monitor showed skipped beats. He took a baby aspirin and he took a one digoxin pill last night and he went to bed. This morning he took another digoxin pill (which he stopped after last OV) and pt feels ok and HR feels back to normal. Bp today 106/79 with HR of 69.

## 2013-11-04 NOTE — Progress Notes (Signed)
Patient Vincent: Vincent Hampton, male   DOB: 03-May-1940, 73 y.o.   MRN: [id number] Patient Vincent: Vincent Hampton, male   DOB: Mar 24, 1941, 73 y.o.   MRN: [id number]    [place], Vincent [address], [place]  [address] Phone: [phone] Fax:  [phone]  Date:  11/04/2013   Vincent:  Vincent Hampton, DOB [birth year], MRN [id number]  PCP:  Gennette Pac, MD      History of Present Illness: Vincent Hampton is a 73 y.o. male who has had PAF. He had a prox LAD stent for unstable angina sx in Jan 2014. His anginal equivalent was left arm pain. This has resolved post stent. CAD/ASCVD:  c/o Leg edema transient, when on his feet all day and of the medicine.  Denies : Chest pain.  Diaphoresis.  Dyspnea on exertion.  Fatigue.  Nitroglycerin.  Orthopnea.  Palpitations.  Paroxysmal nocturnal dyspnea.    Is doing well. Denies chest pain, SHOB, PND, palpitations, LE edema. No bleeding, but has some nuisance bruising. Walks regularly, 4000 steps daily. Climbs ladders daily for work w/o [facility] and chest pain. BP is well-controlled, SBP usually around 120s whenever he got it checked at his dr. appt. Does not check BPs at home.    He has had some fatigue spells over the past few weeks.  Last night, he felt poorly.  He did not notice any palpitations at the time until later.  He had blurry vision and then felt some irregularities.  The machine readings were erratic with some error readings.  Blurry vision lasted in both eyes, he could not focus, lasted 5 minutes and then cleared spontaneously.  He took an aspirin and digoxin.   Wt Readings from Last 3 Encounters:  11/04/13 143 lb (64.864 kg)  08/31/13 139 lb (63.05 kg)  02/19/13 139 lb (63.05 kg)     Past Medical History  Diagnosis Date  . Atrial fibrillation   . Hypertension   . Prostate cancer   . Chest pain, unspecified   . Mixed hyperlipidemia   . Nonspecific abnormal unspecified cardiovascular function study   . Intermediate coronary  syndrome     Current Outpatient Prescriptions  Medication Sig Dispense Refill  . aspirin EC 81 MG EC tablet Take 1 tablet (81 mg total) by mouth daily.      . Cetirizine HCl (ZYRTEC ALLERGY PO) Take 1 tablet by mouth daily.      . clopidogrel (PLAVIX) 75 MG tablet Take 1 tablet (75 mg total) by mouth daily with breakfast.  90 tablet  3  . NITROSTAT 0.4 MG SL tablet As directed      . simvastatin (ZOCOR) 40 MG tablet Take 1 tablet (40 mg total) by mouth daily at 6 PM.  90 tablet  3  . sotalol (BETAPACE) 80 MG tablet Take 1 tablet (80 mg total) by mouth 2 (two) times daily.  180 tablet  2  . triamterene-hydrochlorothiazide (MAXZIDE-25) 37.5-25 MG per tablet Take 1 tablet by mouth daily.  90 tablet  2   No current facility-administered medications for this visit.    Allergies:   No Known Allergies  Social History:  The patient  reports that he has quit smoking. He does not have any smokeless tobacco history on file. He reports that he does not drink alcohol or use illicit drugs.   Family History:  The patient's family history includes Alzheimer's disease in his mother; Cancer in his father; Lung cancer in his sister.  ROS:  Please see the history of present illness.  No nausea, vomiting.  No fevers, chills.  No focal weakness.  No dysuria. Occasional fatigue.  All other systems reviewed and negative.   PHYSICAL EXAM: VS:  BP 110/72  Pulse 75  Ht 6\' 2"  (1.88 m)  Wt 143 lb (64.864 kg)  BMI 18.35 kg/m2 Well nourished, well developed, in no acute distress HEENT: normal Neck: no JVD, no carotid bruits Cardiac:  normal S1, S2; RRR;  Lungs:  clear to auscultation bilaterally, no wheezing, rhonchi or rales Abd: soft, nontender, no hepatomegaly Ext: no edema Skin: warm and dry Neuro:   no focal abnormalities noted  EKG:  NSR right bundle branch block, normal PR interval    ASSESSMENT AND PLAN:  Atrial fibrillation  Continue Sotalol HCl Tablet, 80 MG, 1 tablet, Orally, Twice a  day Notes: [place] AFib last night and possibly longer.  Stop dual antiplatelet therapy.  Start anticoagulation for atrial fibrillation with Eliquis 5 mg BID.  If insurance coverage of Xarelto is better, then we can use that instead. COnsider increasing sotalol of palpitations persist.  If more sx, consider monitor as well.  2. CAD in native artery  Stopping Aspirin EC Low Dose Tablet Delayed Release, 81 MG, 1 tablet, Orally, Once a day  Stopping  Plavix Tablet, 75 MG, 1 tablet, Orally, Once a day, 90 days, 90, Refills 3 No angina.  No antiplatelet therapy with his anticoagulation. 3. fatigue: may be related to AFib.  Start metoprolol 12.5 mg BID.  If sx recur, he could have a higher dose.   4. hyperlipidemia: Lipids well controlled in April 2015 with LDL of 90. Continue simvastatin.  5. hypertension: Continue Maxzide. Blood pressure well controlled. This along with age is a RF for CVA.  When he gets to age 82, his CHADS-vasc score will increase.     Signed, Mina Marble, MD, [facility] 11/04/2013 3:18 PM

## 2013-11-04 NOTE — Telephone Encounter (Signed)
Spoke with pt and he still feels that he is having skipped beats. He would like an appt today. Appt made with Dr. Marlou Porch for 2 pm.

## 2013-11-25 DIAGNOSIS — R059 Cough, unspecified: Secondary | ICD-10-CM | POA: Diagnosis not present

## 2013-11-25 DIAGNOSIS — R05 Cough: Secondary | ICD-10-CM | POA: Diagnosis not present

## 2013-12-14 ENCOUNTER — Other Ambulatory Visit: Payer: Medicare Other

## 2013-12-15 ENCOUNTER — Other Ambulatory Visit (INDEPENDENT_AMBULATORY_CARE_PROVIDER_SITE_OTHER): Payer: Medicare Other

## 2013-12-15 DIAGNOSIS — I4891 Unspecified atrial fibrillation: Secondary | ICD-10-CM

## 2013-12-15 DIAGNOSIS — I480 Paroxysmal atrial fibrillation: Secondary | ICD-10-CM

## 2013-12-15 LAB — BASIC METABOLIC PANEL
BUN: 23 mg/dL (ref 6–23)
CO2: 29 mEq/L (ref 19–32)
Calcium: 9.2 mg/dL (ref 8.4–10.5)
Chloride: 101 mEq/L (ref 96–112)
Creatinine, Ser: 1.2 mg/dL (ref 0.4–1.5)
GFR: 65.99 mL/min (ref >60.00)
Glucose, Bld: 86 mg/dL (ref 70–99)
POTASSIUM: 3.5 meq/L (ref 3.5–5.1)
SODIUM: 139 meq/L (ref 135–145)

## 2013-12-16 LAB — CBC
HEMATOCRIT: 41.2 % (ref 39.0–52.0)
HEMOGLOBIN: 13.9 g/dL (ref 13.0–17.0)
MCHC: 33.8 g/dL (ref 30.0–36.0)
MCV: 92 fl (ref 78.0–100.0)
Platelets: 239 10*3/uL (ref 150.0–400.0)
RBC: 4.48 Mil/uL (ref 4.22–5.81)
RDW: 12.8 % (ref 11.5–15.5)
WBC: 5.7 10*3/uL (ref 4.0–10.5)

## 2013-12-30 ENCOUNTER — Other Ambulatory Visit: Payer: Self-pay | Admitting: Cardiology

## 2013-12-30 DIAGNOSIS — I480 Paroxysmal atrial fibrillation: Secondary | ICD-10-CM

## 2014-01-04 DIAGNOSIS — L821 Other seborrheic keratosis: Secondary | ICD-10-CM | POA: Diagnosis not present

## 2014-01-04 DIAGNOSIS — D229 Melanocytic nevi, unspecified: Secondary | ICD-10-CM | POA: Diagnosis not present

## 2014-01-04 DIAGNOSIS — L719 Rosacea, unspecified: Secondary | ICD-10-CM | POA: Diagnosis not present

## 2014-01-04 DIAGNOSIS — Z85828 Personal history of other malignant neoplasm of skin: Secondary | ICD-10-CM | POA: Diagnosis not present

## 2014-01-15 ENCOUNTER — Other Ambulatory Visit: Payer: Self-pay

## 2014-01-15 DIAGNOSIS — J329 Chronic sinusitis, unspecified: Secondary | ICD-10-CM | POA: Diagnosis not present

## 2014-01-15 DIAGNOSIS — Z23 Encounter for immunization: Secondary | ICD-10-CM | POA: Diagnosis not present

## 2014-02-03 ENCOUNTER — Other Ambulatory Visit: Payer: Self-pay

## 2014-02-03 MED ORDER — SOTALOL HCL 80 MG PO TABS: 80.0000 mg | ORAL_TABLET | Freq: Two times a day (BID) | ORAL | Status: DC

## 2014-02-24 ENCOUNTER — Other Ambulatory Visit: Payer: Self-pay | Admitting: *Deleted

## 2014-02-24 MED ORDER — TRIAMTERENE-HCTZ 37.5-25 MG PO TABS: 1.0000 | ORAL_TABLET | Freq: Every day | ORAL | Status: DC

## 2014-03-11 ENCOUNTER — Encounter (HOSPITAL_COMMUNITY): Payer: Self-pay | Admitting: Interventional Cardiology

## 2014-03-15 ENCOUNTER — Encounter: Payer: Self-pay | Admitting: Interventional Cardiology

## 2014-03-15 ENCOUNTER — Ambulatory Visit (INDEPENDENT_AMBULATORY_CARE_PROVIDER_SITE_OTHER): Payer: Medicare Other | Admitting: Interventional Cardiology

## 2014-03-15 VITALS — BP 106/86 | HR 60 | Ht 74.0 in | Wt 144.4 lb

## 2014-03-15 DIAGNOSIS — I2510 Atherosclerotic heart disease of native coronary artery without angina pectoris: Secondary | ICD-10-CM | POA: Diagnosis not present

## 2014-03-15 DIAGNOSIS — E782 Mixed hyperlipidemia: Secondary | ICD-10-CM | POA: Diagnosis not present

## 2014-03-15 DIAGNOSIS — I10 Essential (primary) hypertension: Secondary | ICD-10-CM | POA: Diagnosis not present

## 2014-03-15 DIAGNOSIS — I480 Paroxysmal atrial fibrillation: Secondary | ICD-10-CM | POA: Diagnosis not present

## 2014-03-15 NOTE — Progress Notes (Signed)
Patient ID: RUGER SAXER, male   DOB: [birth year], 73 y.o.   MRN: [id number] Patient ID: LINDWOOD MOGEL, male   DOB: 02/09/1941, 73 y.o.   MRN: [id number] Patient ID: CLAYSON RILING, male   DOB: 06-17-1940, 73 y.o.   MRN: [id number]    [address], [address] [place], [place]  [address] Phone: [phone] Fax:  [phone]  Date:  03/15/2014   ID:  JOHNDAVID GERALDS, DOB [birth year], MRN [id number]  PCP:  Gennette Pac, MD      History of Present Illness: MIHAILO SAGE is a 73 y.o. male who has had PAF. He had a prox LAD stent for unstable angina sx in Jan 2014. His anginal equivalent was left arm pain. This has resolved post stent. CAD/ASCVD:  c/o Leg edema transient, when on his feet all day and of the medicine.  Denies : Chest pain.  Diaphoresis.  Dyspnea on exertion.  Fatigue.  Nitroglycerin.  Orthopnea.  Palpitations.  Paroxysmal nocturnal dyspnea.    Is doing well. Denies chest pain, SHOB, PND, palpitations, LE edema. No bleeding, but has some nuisance bruising. Walks regularly, 4000 steps daily. Climbs ladders daily for work w/o [facility] and chest pain. BP is well-controlled, SBP usually around 120s whenever he got it checked at his dr. appt. Does not check BPs at home.    Less fatigue.  BP cuff is not registering irregular rhythms at this time.    Wt Readings from Last 3 Encounters:  03/15/14 144 lb 6.4 oz (65.499 kg)  11/04/13 143 lb (64.864 kg)  08/31/13 139 lb (63.05 kg)     Past Medical History  Diagnosis Date  . Atrial fibrillation   . Hypertension   . Prostate cancer   . Chest pain, unspecified   . Mixed hyperlipidemia   . Nonspecific abnormal unspecified cardiovascular function study   . Intermediate coronary syndrome     Current Outpatient Prescriptions  Medication Sig Dispense Refill  . apixaban (ELIQUIS) 5 MG TABS tablet Take 1 tablet (5 mg total) by mouth 2 (two) times daily. 60 tablet 11  . Cetirizine HCl (ZYRTEC ALLERGY PO) Take 1  tablet by mouth daily.    . metoprolol tartrate (LOPRESSOR) 25 MG tablet Take 0.5 tablets (12.5 mg total) by mouth 2 (two) times daily. 30 tablet 11  . NITROSTAT 0.4 MG SL tablet As directed    . PROAIR HFA 108 (90 BASE) MCG/ACT inhaler as needed.    . simvastatin (ZOCOR) 40 MG tablet Take 1 tablet (40 mg total) by mouth daily at 6 PM. 90 tablet 3  . sotalol (BETAPACE) 80 MG tablet Take 1 tablet (80 mg total) by mouth 2 (two) times daily. 180 tablet 2  . triamterene-hydrochlorothiazide (MAXZIDE-25) 37.5-25 MG per tablet Take 1 tablet by mouth daily. 90 tablet 1   No current facility-administered medications for this visit.    Allergies:   No Known Allergies  Social History:  The patient  reports that he has quit smoking. He does not have any smokeless tobacco history on file. He reports that he does not drink alcohol or use illicit drugs.   Family History:  The patient's family history includes Alzheimer's disease in his mother; Cancer in his father; Lung cancer in his sister.   ROS:  Please see the history of present illness.  No nausea, vomiting.  No fevers, chills.  No focal weakness.  No dysuria. Occasional fatigue.  All other systems reviewed and negative.   PHYSICAL  EXAM: VS:  BP 106/86 mmHg  Pulse 60  Ht 6\' 2"  (1.88 m)  Wt 144 lb 6.4 oz (65.499 kg)  BMI 18.53 kg/m2 Well nourished, well developed, in no acute distress HEENT: normal Neck: no JVD, no carotid bruits Cardiac:  normal S1, S2; RRR;  Lungs:  clear to auscultation bilaterally, no wheezing, rhonchi or rales Abd: soft, nontender, no hepatomegaly Ext: no edema Skin: warm and dry Neuro:   no focal abnormalities noted Psych: normal affect  EKG:  NSR right bundle branch block, normal PR interval    ASSESSMENT AND PLAN:  Atrial fibrillation  Continue Sotalol HCl Tablet, 80 MG, 1 tablet, Orally, Twice a day Notes: AFib a few months ago.  Tolerating anticoagulation for atrial fibrillation with Eliquis 5 mg BID.   Consider increasing sotalol of palpitations persist.  If more sx, consider monitor as well.  2. CAD in native artery  Stopped Aspirin EC Low Dose Tablet Delayed Release, 81 MG, 1 tablet, Orally, Once a day  Stopped Plavix Tablet, 75 MG, 1 tablet, Orally, Once a day, 90 days, 90, Refills 3 No angina.  No antiplatelet therapy with his anticoagulation. 3. fatigue: Resolved.  Tolerating metoprolol 12.5 mg BID, which was started after last AFib.  If sx recur, he could have a higher dose.   4. hyperlipidemia: Lipids well controlled in April 2015 with LDL of 90. Continue simvastatin.  5. hypertension: Continue Maxzide. Blood pressure well controlled. This along with age is a RF for CVA.  When he gets to age 73, his CHADS-vasc score will increase.     Signed, Mina Marble, MD, [facility] 03/15/2014 11:29 AM

## 2014-03-15 NOTE — Patient Instructions (Signed)
Your physician recommends that you continue on your current medications as directed. Please refer to the Current Medication list given to you today.  Your physician wants you to follow-up in: 1 year with Dr. Varanasi. You will receive a reminder letter in the mail two months in advance. If you don't receive a letter, please call our office to schedule the follow-up appointment.  

## 2014-04-15 DIAGNOSIS — L719 Rosacea, unspecified: Secondary | ICD-10-CM | POA: Diagnosis not present

## 2014-04-15 DIAGNOSIS — D485 Neoplasm of uncertain behavior of skin: Secondary | ICD-10-CM | POA: Diagnosis not present

## 2014-04-16 DIAGNOSIS — L988 Other specified disorders of the skin and subcutaneous tissue: Secondary | ICD-10-CM | POA: Diagnosis not present

## 2014-06-09 ENCOUNTER — Other Ambulatory Visit: Payer: Self-pay | Admitting: *Deleted

## 2014-06-09 MED ORDER — SIMVASTATIN 40 MG PO TABS: 40.0000 mg | ORAL_TABLET | Freq: Every day | ORAL | Status: DC

## 2014-06-21 ENCOUNTER — Other Ambulatory Visit (INDEPENDENT_AMBULATORY_CARE_PROVIDER_SITE_OTHER): Payer: Medicare Other | Admitting: *Deleted

## 2014-06-21 DIAGNOSIS — E782 Mixed hyperlipidemia: Secondary | ICD-10-CM

## 2014-06-21 DIAGNOSIS — I480 Paroxysmal atrial fibrillation: Secondary | ICD-10-CM | POA: Diagnosis not present

## 2014-06-21 LAB — HEPATIC FUNCTION PANEL
ALT: 27 U/L (ref 0–53)
AST: 24 U/L (ref 0–37)
Albumin: 3.9 g/dL (ref 3.5–5.2)
Alkaline Phosphatase: 86 U/L (ref 39–117)
BILIRUBIN DIRECT: 0.1 mg/dL (ref 0.0–0.3)
BILIRUBIN TOTAL: 0.4 mg/dL (ref 0.2–1.2)
Total Protein: 6.8 g/dL (ref 6.0–8.3)

## 2014-06-21 LAB — LIPID PANEL
Cholesterol: 170 mg/dL (ref 0–200)
HDL: 54.5 mg/dL (ref >39.00)
LDL Cholesterol: 95 mg/dL (ref 0–99)
NONHDL: 115.5
Total CHOL/HDL Ratio: 3
Triglycerides: 103 mg/dL (ref 0.0–149.0)
VLDL: 20.6 mg/dL (ref 0.0–40.0)

## 2014-06-21 LAB — BASIC METABOLIC PANEL
BUN: 25 mg/dL — ABNORMAL HIGH (ref 6–23)
CHLORIDE: 104 meq/L (ref 96–112)
CO2: 30 mEq/L (ref 19–32)
CREATININE: 1.16 mg/dL (ref 0.40–1.50)
Calcium: 9.3 mg/dL (ref 8.4–10.5)
GFR: 65.24 mL/min (ref >60.00)
Glucose, Bld: 98 mg/dL (ref 70–99)
Potassium: 3.6 mEq/L (ref 3.5–5.1)
Sodium: 140 mEq/L (ref 135–145)

## 2014-06-21 LAB — CBC
HCT: 44.3 % (ref 39.0–52.0)
HEMOGLOBIN: 15 g/dL (ref 13.0–17.0)
MCHC: 33.8 g/dL (ref 30.0–36.0)
MCV: 90.8 fl (ref 78.0–100.0)
Platelets: 294 10*3/uL (ref 150.0–400.0)
RBC: 4.88 Mil/uL (ref 4.22–5.81)
RDW: 13.1 % (ref 11.5–15.5)
WBC: 6.8 10*3/uL (ref 4.0–10.5)

## 2014-06-29 DIAGNOSIS — J309 Allergic rhinitis, unspecified: Secondary | ICD-10-CM | POA: Diagnosis not present

## 2014-06-29 DIAGNOSIS — J9801 Acute bronchospasm: Secondary | ICD-10-CM | POA: Diagnosis not present

## 2014-07-19 ENCOUNTER — Other Ambulatory Visit: Payer: Medicare Other

## 2014-08-26 ENCOUNTER — Other Ambulatory Visit: Payer: Self-pay | Admitting: Interventional Cardiology

## 2014-09-27 ENCOUNTER — Other Ambulatory Visit: Payer: Self-pay

## 2014-09-29 ENCOUNTER — Ambulatory Visit (INDEPENDENT_AMBULATORY_CARE_PROVIDER_SITE_OTHER): Payer: Medicare Other | Admitting: Interventional Cardiology

## 2014-09-29 ENCOUNTER — Encounter: Payer: Self-pay | Admitting: Interventional Cardiology

## 2014-09-29 VITALS — BP 116/78 | HR 57 | Ht 74.0 in | Wt 150.1 lb

## 2014-09-29 DIAGNOSIS — I480 Paroxysmal atrial fibrillation: Secondary | ICD-10-CM

## 2014-09-29 DIAGNOSIS — E782 Mixed hyperlipidemia: Secondary | ICD-10-CM | POA: Diagnosis not present

## 2014-09-29 NOTE — Patient Instructions (Signed)
Medication Instructions:  None  Labwork: None  Testing/Procedures: None  Follow-Up: Your physician wants you to follow-up in: 1 year with Dr. Varanasi.  You will receive a reminder letter in the mail two months in advance. If you don't receive a letter, please call our office to schedule the follow-up appointment.    Any Other Special Instructions Will Be Listed Below (If Applicable).   

## 2014-09-29 NOTE — Progress Notes (Signed)
Patient ID: Vincent Hampton, male   DOB: 05-29-1940, 74 y.o.   MRN: [id number]     Cardiology Office Note   Date:  09/29/2014   ID:  Vincent Hampton, DOB November 06, 1940, MRN [id number]  PCP:  Gennette Pac, MD    No chief complaint on file. f/u AFib, CAD   Wt Readings from Last 3 Encounters:  09/29/14 150 lb 1.9 oz (68.094 kg)  03/15/14 144 lb 6.4 oz (65.499 kg)  11/04/13 143 lb (64.864 kg)       History of Present Illness: Vincent Hampton is a 74 y.o. male  who has had PAF. He had a prox LAD stent in Jan 2014 for unstable angina sx. His anginal equivalent was left arm pain. This has resolved post stent. CAD/ASCVD:  No further Leg edema  Denies : Chest pain.  Diaphoresis.  Dyspnea on exertion.  Fatigue.  Nitroglycerin.  Orthopnea.  Palpitations.  Paroxysmal nocturnal dyspnea.    Is doing well. Denies chest pain, SHOB, PND, palpitations, LE edema. Walks regularly, 4000- 10,000 steps daily. Climbs ladders daily for work w/o [facility] [employer] [facility] and chest pain. BP is well-controlled, SBP usually around 120s whenever he got it checked at his dr. appt. Does not check BPs at home.   No bleeding, but has some nuisance bruising. Limits his alcohol intake to one drink a day.  He does exceed this.  He has a reunion planned for the 4th of July.  He will likely drink more.    Past Medical History  Diagnosis Date  . Atrial fibrillation   . Hypertension   . Prostate cancer   . Chest pain, unspecified   . Mixed hyperlipidemia   . Nonspecific abnormal unspecified cardiovascular function study   . Intermediate coronary syndrome     Past Surgical History  Procedure Laterality Date  . Prostatectomy    . Hernia repair    . Percutaneous coronary stent intervention (pci-s) N/A 05/02/2012    Procedure: PERCUTANEOUS CORONARY STENT INTERVENTION (PCI-S);  Surgeon: Jettie Booze, MD;  Location: [facility] CATH LAB;  Service: Cardiovascular;  Laterality: N/A;     Current Outpatient Prescriptions    Medication Sig Dispense Refill  . apixaban (ELIQUIS) 5 MG TABS tablet Take 1 tablet (5 mg total) by mouth 2 (two) times daily. 60 tablet 11  . Cetirizine HCl (ZYRTEC ALLERGY PO) Take 1 tablet by mouth daily.    . metoprolol tartrate (LOPRESSOR) 25 MG tablet Take 0.5 tablets (12.5 mg total) by mouth 2 (two) times daily. 30 tablet 11  . NITROSTAT 0.4 MG SL tablet Place 0.4 mg under the tongue every 5 (five) minutes as needed for chest pain. As directed    . PROAIR HFA 108 (90 BASE) MCG/ACT inhaler Inhale 1 puff into the lungs as needed for wheezing or shortness of breath.     . simvastatin (ZOCOR) 40 MG tablet Take 1 tablet (40 mg total) by mouth daily at 6 PM. 90 tablet 2  . sotalol (BETAPACE) 80 MG tablet Take 1 tablet (80 mg total) by mouth 2 (two) times daily. 180 tablet 2  . triamterene-hydrochlorothiazide (MAXZIDE-25) 37.5-25 MG per tablet TAKE ONE TABLET BY MOUTH ONCE DAILY 90 tablet 0   No current facility-administered medications for this visit.    Allergies:   Review of patient's allergies indicates no known allergies.    Social History:  The patient  reports that he has quit smoking. He has never used smokeless tobacco. He reports that he does not drink  alcohol or use illicit drugs.   Family History:  The patient's family history includes Alzheimer's disease in his mother; Cancer in his father; Lung cancer in his sister.    ROS:  Please see the history of present illness.   Otherwise, review of systems are positive for nasal congestion and hand pain.   All other systems are reviewed and negative.    PHYSICAL EXAM: VS:  BP 116/78 mmHg  Pulse 57  Ht '6\' 2"'  (1.88 m)  Wt 150 lb 1.9 oz (68.094 kg)  BMI 19.27 kg/m2 , BMI Body mass index is 19.27 kg/(m^2). GEN: Well nourished, well developed, in no acute distress HEENT: normal Neck: no JVD, carotid bruits, or masses Cardiac: RRR; no murmurs, rubs, or gallops,no edema  Respiratory:  clear to auscultation bilaterally, normal work  of breathing GI: soft, nontender, nondistended, + BS MS: no deformity or atrophy Skin: warm and dry, no rash Neuro:  Strength and sensation are intact Psych: euthymic mood, full affect   EKG:   The ekg ordered today demonstrates NSR, RBBB, LAFB   Recent Labs: 06/21/2014: ALT 27; BUN 25*; Creatinine, Ser 1.16; Hemoglobin 15.0; Platelets 294.0; Potassium 3.6; Sodium 140   Lipid Panel    Component Value Date/Time   CHOL 170 06/21/2014 1049   TRIG 103.0 06/21/2014 1049   HDL 54.50 06/21/2014 1049   CHOLHDL 3 06/21/2014 1049   VLDL 20.6 06/21/2014 1049   LDLCALC 95 06/21/2014 1049     Other studies Reviewed: Additional studies/ records that were reviewed today with results demonstrating: LDL 95 in 3/16.   ASSESSMENT AND PLAN:  1. CAD: stable. Off of anti platelet therapy due to being on anticoagulation. 2. AFib: maintaining sinus rhythm. No further palpitations. Continue sotalol at the current dose.  Eliquis for anticoagulation. We spoke about trying to limit alcohol intake due to the anticoagulation. He does admit to having more than one drink a day at times. 3. Hyperlipidemia: lipids well controlled. Continue current lipid-lowering therapy. Labs from March reviewed. 4. VKP:QAES controlled. Continue current medicines.   Current medicines are reviewed at length with the patient today.  The patient concerns regarding his medicines were addressed.  The following changes have been made:  No change  Labs/ tests ordered today include: he'll need a CBC and be met in September 2016. No orders of the defined types were placed in this encounter.    Recommend 150 minutes/week of aerobic exercise Low fat, low carb, high fiber diet recommended  Disposition:   FU in 1 year   Teresita Madura., MD  09/29/2014 11:21 AM    [facility] [address], [place], [place]  [address] Phone: [phone]; Fax: [phone]

## 2014-10-27 ENCOUNTER — Other Ambulatory Visit: Payer: Self-pay | Admitting: Interventional Cardiology

## 2014-11-04 DIAGNOSIS — W57XXXA Bitten or stung by nonvenomous insect and other nonvenomous arthropods, initial encounter: Secondary | ICD-10-CM | POA: Diagnosis not present

## 2014-11-04 DIAGNOSIS — S90561A Insect bite (nonvenomous), right ankle, initial encounter: Secondary | ICD-10-CM | POA: Diagnosis not present

## 2014-11-04 DIAGNOSIS — S90569A Insect bite (nonvenomous), unspecified ankle, initial encounter: Secondary | ICD-10-CM | POA: Diagnosis not present

## 2014-11-26 ENCOUNTER — Other Ambulatory Visit: Payer: Self-pay | Admitting: Interventional Cardiology

## 2015-01-31 DIAGNOSIS — Z85828 Personal history of other malignant neoplasm of skin: Secondary | ICD-10-CM | POA: Diagnosis not present

## 2015-01-31 DIAGNOSIS — L821 Other seborrheic keratosis: Secondary | ICD-10-CM | POA: Diagnosis not present

## 2015-01-31 DIAGNOSIS — D225 Melanocytic nevi of trunk: Secondary | ICD-10-CM | POA: Diagnosis not present

## 2015-01-31 DIAGNOSIS — L719 Rosacea, unspecified: Secondary | ICD-10-CM | POA: Diagnosis not present

## 2015-02-27 ENCOUNTER — Other Ambulatory Visit: Payer: Self-pay | Admitting: Interventional Cardiology

## 2015-03-02 ENCOUNTER — Other Ambulatory Visit: Payer: Self-pay | Admitting: Interventional Cardiology

## 2015-03-03 DIAGNOSIS — Z23 Encounter for immunization: Secondary | ICD-10-CM | POA: Diagnosis not present

## 2015-05-20 ENCOUNTER — Other Ambulatory Visit: Payer: Self-pay | Admitting: Interventional Cardiology

## 2015-06-06 DIAGNOSIS — D485 Neoplasm of uncertain behavior of skin: Secondary | ICD-10-CM | POA: Diagnosis not present

## 2015-06-06 DIAGNOSIS — L570 Actinic keratosis: Secondary | ICD-10-CM | POA: Diagnosis not present

## 2015-06-23 ENCOUNTER — Other Ambulatory Visit: Payer: Self-pay | Admitting: Interventional Cardiology

## 2015-07-01 DIAGNOSIS — M79674 Pain in right toe(s): Secondary | ICD-10-CM | POA: Diagnosis not present

## 2015-08-04 DIAGNOSIS — M109 Gout, unspecified: Secondary | ICD-10-CM | POA: Diagnosis not present

## 2015-08-04 DIAGNOSIS — I480 Paroxysmal atrial fibrillation: Secondary | ICD-10-CM | POA: Diagnosis not present

## 2015-08-04 DIAGNOSIS — R609 Edema, unspecified: Secondary | ICD-10-CM | POA: Diagnosis not present

## 2015-08-04 DIAGNOSIS — Z79899 Other long term (current) drug therapy: Secondary | ICD-10-CM | POA: Diagnosis not present

## 2015-08-14 ENCOUNTER — Encounter (HOSPITAL_COMMUNITY): Payer: Self-pay | Admitting: Emergency Medicine

## 2015-08-14 ENCOUNTER — Emergency Department (HOSPITAL_COMMUNITY)
Admission: EM | Admit: 2015-08-14 | Discharge: 2015-08-14 | Disposition: A | Discharge status: Home / Self Care | Payer: Medicare Other | Attending: Emergency Medicine | Admitting: Emergency Medicine

## 2015-08-14 ENCOUNTER — Emergency Department (HOSPITAL_COMMUNITY): Payer: Medicare Other

## 2015-08-14 DIAGNOSIS — I200 Unstable angina: Secondary | ICD-10-CM | POA: Diagnosis not present

## 2015-08-14 DIAGNOSIS — E782 Mixed hyperlipidemia: Secondary | ICD-10-CM | POA: Insufficient documentation

## 2015-08-14 DIAGNOSIS — R0602 Shortness of breath: Secondary | ICD-10-CM | POA: Diagnosis not present

## 2015-08-14 DIAGNOSIS — Z79899 Other long term (current) drug therapy: Secondary | ICD-10-CM | POA: Insufficient documentation

## 2015-08-14 DIAGNOSIS — Z8546 Personal history of malignant neoplasm of prostate: Secondary | ICD-10-CM | POA: Diagnosis not present

## 2015-08-14 DIAGNOSIS — Z7901 Long term (current) use of anticoagulants: Secondary | ICD-10-CM | POA: Diagnosis not present

## 2015-08-14 DIAGNOSIS — I480 Paroxysmal atrial fibrillation: Secondary | ICD-10-CM

## 2015-08-14 DIAGNOSIS — J180 Bronchopneumonia, unspecified organism: Secondary | ICD-10-CM | POA: Diagnosis not present

## 2015-08-14 DIAGNOSIS — R531 Weakness: Secondary | ICD-10-CM | POA: Diagnosis not present

## 2015-08-14 DIAGNOSIS — J40 Bronchitis, not specified as acute or chronic: Secondary | ICD-10-CM

## 2015-08-14 DIAGNOSIS — R05 Cough: Secondary | ICD-10-CM | POA: Diagnosis present

## 2015-08-14 DIAGNOSIS — Z87891 Personal history of nicotine dependence: Secondary | ICD-10-CM | POA: Diagnosis not present

## 2015-08-14 DIAGNOSIS — E875 Hyperkalemia: Secondary | ICD-10-CM | POA: Diagnosis not present

## 2015-08-14 DIAGNOSIS — I10 Essential (primary) hypertension: Secondary | ICD-10-CM | POA: Diagnosis not present

## 2015-08-14 DIAGNOSIS — I959 Hypotension, unspecified: Secondary | ICD-10-CM | POA: Diagnosis not present

## 2015-08-14 DIAGNOSIS — E876 Hypokalemia: Secondary | ICD-10-CM | POA: Diagnosis not present

## 2015-08-14 DIAGNOSIS — R42 Dizziness and giddiness: Secondary | ICD-10-CM | POA: Diagnosis not present

## 2015-08-14 LAB — BASIC METABOLIC PANEL
Anion gap: 9 (ref 5–15)
BUN: 22 mg/dL — AB (ref 6–20)
CALCIUM: 9.1 mg/dL (ref 8.9–10.3)
CHLORIDE: 101 mmol/L (ref 101–111)
CO2: 28 mmol/L (ref 22–32)
CREATININE: 1.17 mg/dL (ref 0.61–1.24)
GFR, EST NON AFRICAN AMERICAN: 59 mL/min — AB (ref >60)
Glucose, Bld: 95 mg/dL (ref 65–99)
Potassium: 3.4 mmol/L — ABNORMAL LOW (ref 3.5–5.1)
SODIUM: 138 mmol/L (ref 135–145)

## 2015-08-14 LAB — CBC
HCT: 46.4 % (ref 39.0–52.0)
Hemoglobin: 15.9 g/dL (ref 13.0–17.0)
MCH: 31.3 pg (ref 26.0–34.0)
MCHC: 34.3 g/dL (ref 30.0–36.0)
MCV: 91.3 fL (ref 78.0–100.0)
PLATELETS: 195 10*3/uL (ref 150–400)
RBC: 5.08 MIL/uL (ref 4.22–5.81)
RDW: 12.4 % (ref 11.5–15.5)
WBC: 5.3 10*3/uL (ref 4.0–10.5)

## 2015-08-14 LAB — I-STAT TROPONIN, ED: TROPONIN I, POC: 0 ng/mL (ref 0.00–0.08)

## 2015-08-14 MED ORDER — POTASSIUM CHLORIDE CRYS ER 20 MEQ PO TBCR
40.0000 meq | EXTENDED_RELEASE_TABLET | Freq: Once | ORAL | Status: AC
  Administered 2015-08-14: 40 meq via ORAL
  Filled 2015-08-14: qty 2

## 2015-08-14 MED ORDER — ALBUTEROL SULFATE HFA 108 (90 BASE) MCG/ACT IN AERS
2.0000 | INHALATION_SPRAY | Freq: Once | RESPIRATORY_TRACT | Status: AC
Start: 2015-08-14 — End: 2015-08-14
  Administered 2015-08-14: 2 via RESPIRATORY_TRACT
  Filled 2015-08-14: qty 6.7

## 2015-08-14 MED ORDER — PREDNISONE 20 MG PO TABS
40.0000 mg | ORAL_TABLET | Freq: Once | ORAL | Status: AC
Start: 2015-08-14 — End: 2015-08-14
  Administered 2015-08-14: 40 mg via ORAL
  Filled 2015-08-14: qty 2

## 2015-08-14 MED ORDER — PREDNISONE 20 MG PO TABS: ORAL_TABLET | ORAL | Status: DC

## 2015-08-14 MED ORDER — POTASSIUM CHLORIDE CRYS ER 20 MEQ PO TBCR: 20.0000 meq | EXTENDED_RELEASE_TABLET | Freq: Every day | ORAL | Status: DC

## 2015-08-14 NOTE — ED Notes (Signed)
Gave pt sandwich bag and drink

## 2015-08-14 NOTE — ED Provider Notes (Signed)
CSN: [id number]     Arrival date & time 08/14/15  1429 History   First MD Initiated Contact with Patient 08/14/15 1542     Chief Complaint  Patient presents with  . Cough     (Consider location/radiation/quality/duration/timing/severity/associated sxs/prior Treatment) HPI Patient sent from urgent care where he was evaluated for cough and chest congestion. Patient states 5 days ago he started developing some nasal and chest congestion that he thought might be allergic. He had tried some Zyrtec. There has been no associated fever. No chest pain. Symptoms are worse at night time. Patient has felt fatigued over the past few days. He is normally very active. He does significant amount of physical work. Symptoms seem to be improving somewhat toward the end of the week but worsened again with wheezing, coughing and chest congestion over the past day and a half. He tried an albuterol inhaler yesterday evening with some mild improvement. Today at urgent care, reportedly the patient was having an episode of atrial fibrillation. This was remarked by EMS during transport from urgent care. Upon arrival he reports that he was improved and heart rate seemed back to normal. Past Medical History  Diagnosis Date  . Atrial fibrillation ([place])   . Hypertension   . Prostate cancer ([place])   . Chest pain, unspecified   . Mixed hyperlipidemia   . Nonspecific abnormal unspecified cardiovascular function study   . Intermediate coronary syndrome [facility])    Past Surgical History  Procedure Laterality Date  . Prostatectomy    . Hernia repair    . Percutaneous coronary stent intervention (pci-s) N/A 05/02/2012    Procedure: PERCUTANEOUS CORONARY STENT INTERVENTION (PCI-S);  Surgeon: Jettie Booze, MD;  Location: [facility] CATH LAB;  Service: Cardiovascular;  Laterality: N/A;   Family History  Problem Relation Age of Onset  . Alzheimer's disease Mother   . Cancer Father   . Lung cancer Sister    Social History   Substance Use Topics  . Smoking status: Former Research scientist (life sciences)  . Smokeless tobacco: Never Used  . Alcohol Use: No    Review of Systems 10 Systems reviewed and are negative for acute change except as noted in the HPI.   Allergies  Review of patient's allergies indicates no known allergies.  Home Medications   Prior to Admission medications   Medication Sig Start Date End Date Taking? Authorizing Provider  cetirizine (ZYRTEC) 10 MG tablet Take 10 mg by mouth daily as needed for allergies.   Yes Historical Provider, MD  cholecalciferol (VITAMIN D) 1000 units tablet Take 1,000 Units by mouth daily.   Yes Historical Provider, MD  diphenhydrAMINE (BENADRYL ALLERGY) 25 MG tablet Take 25 mg by mouth every 6 (six) hours as needed for allergies.   Yes Historical Provider, MD  ELIQUIS 5 MG TABS tablet TAKE ONE TABLET BY MOUTH TWICE DAILY 06/23/15  Yes Jettie Booze, MD  metoprolol tartrate (LOPRESSOR) 25 MG tablet TAKE ONE-HALF TABLET BY MOUTH TWICE DAILY 02/28/15  Yes Jettie Booze, MD  Misc Natural Products (TURMERIC CURCUMIN) CAPS Take 3 capsules by mouth daily as needed (for inflammation).    Yes Historical Provider, MD  PROAIR HFA 108 (90 BASE) MCG/ACT inhaler Inhale 2 puffs into the lungs every 6 (six) hours as needed for wheezing or shortness of breath.  01/24/14  Yes Historical Provider, MD  simvastatin (ZOCOR) 40 MG tablet TAKE ONE TABLET BY MOUTH ONCE DAILY AT 6 PM 03/02/15  Yes Jettie Booze, MD  sotalol (BETAPACE) 80  MG tablet TAKE ONE TABLET BY MOUTH TWICE DAILY 10/27/14  Yes Jettie Booze, MD  triamterene-hydrochlorothiazide (MAXZIDE-25) 37.5-25 MG tablet TAKE ONE TABLET BY MOUTH ONCE DAILY 05/20/15  Yes Jettie Booze, MD  NITROSTAT 0.4 MG SL tablet Place 0.4 mg under the tongue every 5 (five) minutes as needed for chest pain. As directed 01/20/13   Historical Provider, MD  potassium chloride SA (K-DUR,KLOR-CON) 20 MEQ tablet Take 1 tablet (20 mEq total) by mouth  daily. 08/14/15   Charlesetta Shanks, MD  predniSONE (DELTASONE) 20 MG tablet 2 po daily x 4 days 08/14/15   Charlesetta Shanks, MD   BP 120/85 mmHg  Pulse 72  Temp(Src) 98.4 F (36.9 C) (Oral)  Resp 16  SpO2 94% Physical Exam  Constitutional: He is oriented to person, place, and time. He appears well-developed and well-nourished.  HENT:  Head: Normocephalic and atraumatic.  Nose: Nose normal.  Mouth/Throat: Oropharynx is clear and moist.  Eyes: EOM are normal. Pupils are equal, round, and reactive to light.  Neck: Neck supple.  Cardiovascular: Normal rate, regular rhythm, normal heart sounds and intact distal pulses.   Pulmonary/Chest: Effort normal. No respiratory distress. He has wheezes.  Patient has bilateral expiratory wheeze. Left lung field greater than right. Intermittent dry cough. No respiratory distress at rest.  Abdominal: Soft. Bowel sounds are normal. He exhibits no distension. There is no tenderness.  Musculoskeletal: Normal range of motion. He exhibits no edema or tenderness.  Neurological: He is alert and oriented to person, place, and time. He has normal strength. Coordination normal. GCS eye subscore is 4. GCS verbal subscore is 5. GCS motor subscore is 6.  Skin: Skin is warm, dry and intact.  Psychiatric: He has a normal mood and affect.    ED Course  Procedures (including critical care time) Labs Review Labs Reviewed  BASIC METABOLIC PANEL - Abnormal; Notable for the following:    Potassium 3.4 (*)    BUN 22 (*)    GFR calc non Af Amer 59 (*)    All other components within normal limits  CBC  I-STAT TROPOININ, ED    Imaging Review Dg Chest 2 View  08/14/2015  CLINICAL DATA:  Cough and chest congestion. EXAM: CHEST  2 VIEW COMPARISON:  04/24/2012 FINDINGS: Heart size is normal. Spinal curvature convex to the right is again demonstrated. Old [place] complex with calcified granuloma in the lingula and multiple calcified left hilar nodes as seen previously. No evidence  of active infiltrate, collapse or effusion. IMPRESSION: No active process. 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 Electronically Signed   By: Nelson Chimes M.D.   On: 08/14/2015 15:28   I have personally reviewed and evaluated these images and lab results as part of my medical decision-making.   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  dynamic lateral T waves. Confirmed by Johnney Killian, MD, Jeannie Done [phone]) on  08/14/2015 4:21:06 PM     Consult: (17:08) review Dr. Rosanna Randy of cardiology. OK initiate prednisone and albuterol inhaler with current medications. Patient may take an additional dose of metoprolol if he develops palpitations with treatment. MDM   Final diagnoses:  Bronchitis  Paroxysmal atrial fibrillation (HCC)  Hypokalemia   Clinically the patient has well appearance. He has  been experiencing chest congestion and cough that also had upper respiratory nasal congestion. This had worsened but he feels today that it is somewhat improved. He has been no fever no chest pain. Chest x-ray does not show infiltrate, patient does not have leukocytosis, fever or pleurisy. At this time findings are most consistent with acute bronchitis. Patient has a history of atrial fibrillation. He is treated with Eliquis and sotalol and metoprolol. He has had some episodes of perceiving palpitations with paroxysmal atrial fibrillation. Patient's rate is controlled. There is no signs of congestive heart failure. Consultation has been reviewed with cardiology. At this time we will initiate albuterol and prednisone for acute bronchitis. She may take an additional metoprolol if he has added palpitations with the albuterol. He is instructed to follow closely with family doctor and  cardiologist this week.    Charlesetta Shanks, MD 08/14/15 [phone]

## 2015-08-14 NOTE — ED Notes (Signed)
Off unit with xray. 

## 2015-08-14 NOTE — Discharge Instructions (Signed)
Acute Bronchitis Bronchitis is inflammation of the airways that extend from the windpipe into the lungs (bronchi). The inflammation often causes mucus to develop. This leads to a cough, which is the most common symptom of bronchitis.  In acute bronchitis, the condition usually develops suddenly and goes away over time, usually in a couple weeks. Smoking, allergies, and asthma can make bronchitis worse. Repeated episodes of bronchitis may cause further lung problems.  CAUSES Acute bronchitis is most often caused by the same virus that causes a cold. The virus can spread from person to person (contagious) through coughing, sneezing, and touching contaminated objects. SIGNS AND SYMPTOMS   Cough.   Fever.   Coughing up mucus.   Body aches.   Chest congestion.   Chills.   Shortness of breath.   Sore throat.  DIAGNOSIS  Acute bronchitis is usually diagnosed through a physical exam. Your health care provider will also ask you questions about your medical history. Tests, such as chest X-rays, are sometimes done to rule out other conditions.  TREATMENT  Acute bronchitis usually goes away in a couple weeks. Oftentimes, no medical treatment is necessary. Medicines are sometimes given for relief of fever or cough. Antibiotic medicines are usually not needed but may be prescribed in certain situations. In some cases, an inhaler may be recommended to help reduce shortness of breath and control the cough. A cool mist vaporizer may also be used to help thin bronchial secretions and make it easier to clear the chest.  HOME CARE INSTRUCTIONS  Get plenty of rest.   Drink enough fluids to keep your urine clear or pale yellow (unless you have a medical condition that requires fluid restriction). Increasing fluids may help thin your respiratory secretions (sputum) and reduce chest congestion, and it will prevent dehydration.   Take medicines only as directed by your health care provider.  If  you were prescribed an antibiotic medicine, finish it all even if you start to feel better.  Avoid smoking and secondhand smoke. Exposure to cigarette smoke or irritating chemicals will make bronchitis worse. If you are a smoker, consider using nicotine gum or skin patches to help control withdrawal symptoms. Quitting smoking will help your lungs heal faster.   Reduce the chances of another bout of acute bronchitis by washing your hands frequently, avoiding people with cold symptoms, and trying not to touch your hands to your mouth, nose, or eyes.   Keep all follow-up visits as directed by your health care provider.  SEEK MEDICAL CARE IF: Your symptoms do not improve after 1 week of treatment.  SEEK IMMEDIATE MEDICAL CARE IF:  You develop an increased fever or chills.   You have chest pain.   You have severe shortness of breath.  You have bloody sputum.   You develop dehydration.  You faint or repeatedly feel like you are going to pass out.  You develop repeated vomiting.  You develop a severe headache. MAKE SURE YOU:   Understand these instructions.  Will watch your condition.  Will get help right away if you are not doing well or get worse.   This information is not intended to replace advice given to you by your health care provider. Make sure you discuss any questions you have with your health care provider.   Document Released: 04/26/2004 Document Revised: 04/09/2014 Document Reviewed: 09/09/2012 Elsevier Interactive Patient Education 2016 Else[employer]  Atrial Fibrillation Atrial fibrillation is a type of irregular or rapid heartbeat (arrhythmia). In atrial fibrillation, the heart  quivers continuously in a chaotic pattern. This occurs when parts of the heart receive disorganized signals that make the heart unable to pump blood normally. This can increase the risk for stroke, heart failure, and other heart-related conditions. There are different types of atrial  fibrillation, including:  Paroxysmal atrial fibrillation. This type starts suddenly, and it usually stops on its own shortly after it starts.  Persistent atrial fibrillation. This type often lasts longer than a week. It may stop on its own or with treatment.  Long-lasting persistent atrial fibrillation. This type lasts longer than 12 months.  Permanent atrial fibrillation. This type does not go away. Talk with your health care provider to learn about the type of atrial fibrillation that you have. CAUSES This condition is caused by some heart-related conditions or procedures, including:  A heart attack.  Coronary artery disease.  Heart failure.  Heart valve conditions.  High blood pressure.  Inflammation of the sac that surrounds the heart (pericarditis).  Heart surgery.  Certain heart rhythm disorders, such as Wolf-Parkinson-White syndrome. Other causes include:  Pneumonia.  Obstructive sleep apnea.  Blockage of an artery in the lungs (pulmonary embolism, or PE).  Lung cancer.  Chronic lung disease.  Thyroid problems, especially if the thyroid is overactive (hyperthyroidism).  Caffeine.  Excessive alcohol use or illegal drug use.  Use of some medicines, including certain decongestants and diet pills. Sometimes, the cause cannot be found. RISK FACTORS This condition is more likely to develop in:  People who are older in age.  People who smoke.  People who have diabetes mellitus.  People who are overweight (obese).  Athletes who exercise vigorously. SYMPTOMS Symptoms of this condition include:  A feeling that your heart is beating rapidly or irregularly.  A feeling of discomfort or pain in your chest.  Shortness of breath.  Sudden light-headedness or weakness.  Getting tired easily during exercise. In some cases, there are no symptoms. DIAGNOSIS Your health care provider may be able to detect atrial fibrillation when taking your pulse. If  detected, this condition may be diagnosed with:  An electrocardiogram (ECG).  A Holter monitor test that records your heartbeat patterns over a 24-hour period.  Transthoracic echocardiogram (TTE) to evaluate how blood flows through your heart.  Transesophageal echocardiogram (TEE) to view more detailed images of your heart.  A stress test.  Imaging tests, such as a CT scan or chest X-ray.  Blood tests. TREATMENT The main goals of treatment are to prevent blood clots from forming and to keep your heart beating at a normal rate and rhythm. The type of treatment that you receive depends on many factors, such as your underlying medical conditions and how you feel when you are experiencing atrial fibrillation. This condition may be treated with:  Medicine to slow down the heart rate, bring the heart's rhythm back to normal, or prevent clots from forming.  Electrical cardioversion. This is a procedure that resets your heart's rhythm by delivering a controlled, low-energy shock to the heart through your skin.  Different types of ablation, such as catheter ablation, catheter ablation with pacemaker, or surgical ablation. These procedures destroy the heart tissues that send abnormal signals. When the pacemaker is used, it is placed under your skin to help your heart beat in a regular rhythm. HOME CARE INSTRUCTIONS  Take over-the counter and prescription medicines only as told by your health care provider.  If your health care provider prescribed a blood-thinning medicine (anticoagulant), take it exactly as told. Taking too  much blood-thinning medicine can cause bleeding. If you do not take enough blood-thinning medicine, you will not have the protection that you need against stroke and other problems.  Do not use tobacco products, including cigarettes, chewing tobacco, and e-cigarettes. If you need help quitting, ask your health care provider.  If you have obstructive sleep apnea, manage your  condition as told by your health care provider.  Do not drink alcohol.  Do not drink beverages that contain caffeine, such as coffee, soda, and tea.  Maintain a healthy weight. Do not use diet pills unless your health care provider approves. Diet pills may make heart problems worse.  Follow diet instructions as told by your health care provider.  Exercise regularly as told by your health care provider.  Keep all follow-up visits as told by your health care provider. This is important. PREVENTION  Avoid drinking beverages that contain caffeine or alcohol.  Avoid certain medicines, especially medicines that are used for breathing problems.  Avoid certain herbs and herbal medicines, such as those that contain ephedra or ginseng.  Do not use illegal drugs, such as cocaine and amphetamines.  Do not smoke.  Manage your high blood pressure. SEEK MEDICAL CARE IF:  You notice a change in the rate, rhythm, or strength of your heartbeat.  You are taking an anticoagulant and you notice increased bruising.  You tire more easily when you exercise or exert yourself. SEEK IMMEDIATE MEDICAL CARE IF:  You have chest pain, abdominal pain, sweating, or weakness.  You feel nauseous.  You notice blood in your vomit, bowel movement, or urine.  You have shortness of breath.  You suddenly have swollen feet and ankles.  You feel dizzy.  You have sudden weakness or numbness of the face, arm, or leg, especially on one side of the body.  You have trouble speaking, trouble understanding, or both (aphasia).  Your face or your eyelid droops on one side. These symptoms may represent a serious problem that is an emergency. Do not wait to see if the symptoms will go away. Get medical help right away. Call your local emergency services (911 in the [place] Do not drive yourself to the hospital.   This information is not intended to replace advice given to you by your health care provider. Make sure  you discuss any questions you have with your health care provider.   Document Released: 03/19/2005 Document Revised: 12/08/2014 Document Reviewed: 07/14/2014 Elsevier Interactive Patient Education [employer].

## 2015-08-14 NOTE — ED Notes (Signed)
Per [facility], pt was seen at PCP for cough and congestion. Pt sent here for further evaluation. Pt hx of afib

## 2015-08-16 ENCOUNTER — Encounter: Payer: Self-pay | Admitting: Interventional Cardiology

## 2015-08-16 ENCOUNTER — Ambulatory Visit (INDEPENDENT_AMBULATORY_CARE_PROVIDER_SITE_OTHER): Payer: Medicare Other | Admitting: Interventional Cardiology

## 2015-08-16 VITALS — BP 112/80 | HR 64 | Ht 74.0 in | Wt 150.0 lb

## 2015-08-16 DIAGNOSIS — I480 Paroxysmal atrial fibrillation: Secondary | ICD-10-CM

## 2015-08-16 DIAGNOSIS — E782 Mixed hyperlipidemia: Secondary | ICD-10-CM | POA: Diagnosis not present

## 2015-08-16 DIAGNOSIS — I2510 Atherosclerotic heart disease of native coronary artery without angina pectoris: Secondary | ICD-10-CM

## 2015-08-16 NOTE — Progress Notes (Signed)
Patient ID: Vincent Hampton, male   DOB: 09/11/1940, 75 y.o.   MRN: [id number]     Cardiology Office Note   Date:  08/16/2015   ID:  Vincent Hampton, DOB [birth year], MRN [id number]  PCP:  Gennette Pac, MD    No chief complaint on file. f/u CAD   Wt Readings from Last 3 Encounters:  08/16/15 150 lb (68.04 kg)  09/29/14 150 lb 1.9 oz (68.094 kg)  03/15/14 144 lb 6.4 oz (65.499 kg)       History of Present Illness: Vincent Hampton is a 75 y.o. male  who has had PAF. He had a prox LAD stent in Jan 2014 for unstable angina sx. His anginal equivalent was left arm pain. This has resolved post stent.   Denies chest pain, SHOB, PND, palpitations, LE edema. Walks regularly, 4000- 10,000 steps daily. Climbs ladders daily for work w/o [facility] [employer] and chest pain. BP is well-controlled, SBP usually around 120s whenever he got it checked at his dr. appt. Does not check BPs at home.   No bleeding, but has some nuisance bruising. Limits his alcohol intake to one drink a day. He does exceed this on occasion.  A few days ago, he was sent to the hospital with hypoxemic respiratory failure from urgent care and he was exhausted at that time.  ECG revealed AFib.  He also had bronchitis.  He was not treated with antibiotics, just prednisone.  He went back to NSR after just a few hours.  His rhythm was changing.  He did not spend the night in the hospital.  He was sent home on a prednisone taper. No arm pain like before the stent.     Past Medical History  Diagnosis Date  . Atrial fibrillation ([place])   . Hypertension   . Prostate cancer ([place])   . Chest pain, unspecified   . Mixed hyperlipidemia   . Nonspecific abnormal unspecified cardiovascular function study   . Intermediate coronary syndrome [facility])     Past Surgical History  Procedure Laterality Date  . Prostatectomy    . Hernia repair    . Percutaneous coronary stent intervention (pci-s) N/A 05/02/2012    Procedure: PERCUTANEOUS CORONARY  STENT INTERVENTION (PCI-S);  Surgeon: Jettie Booze, MD;  Location: [facility] CATH LAB;  Service: Cardiovascular;  Laterality: N/A;     Current Outpatient Prescriptions  Medication Sig Dispense Refill  . allopurinol (ZYLOPRIM) 100 MG tablet Take 100 mg by mouth daily as needed (GOUT).     . cetirizine (ZYRTEC) 10 MG tablet Take 10 mg by mouth daily as needed for allergies.    . cholecalciferol (VITAMIN D) 1000 units tablet Take 1,000 Units by mouth daily.    . diphenhydrAMINE (BENADRYL ALLERGY) 25 MG tablet Take 25 mg by mouth every 6 (six) hours as needed for allergies.    [name] ELIQUIS 5 MG TABS tablet TAKE ONE TABLET BY MOUTH TWICE DAILY 60 tablet 2  . metoprolol tartrate (LOPRESSOR) 25 MG tablet TAKE ONE-HALF TABLET BY MOUTH TWICE DAILY 30 tablet 5  . Misc Natural Products (TURMERIC CURCUMIN) CAPS Take 3 capsules by mouth daily as needed (for inflammation).     . nitroGLYCERIN (NITROSTAT) 0.4 MG SL tablet Place 0.4 mg under the tongue every 5 (five) minutes as needed for chest pain (UP TO 3 DOSES BEFORE CALLING EMS).    . potassium chloride SA (K-DUR,KLOR-CON) 20 MEQ tablet Take 1 tablet (20 mEq total) by mouth daily. 5 tablet 0  .  predniSONE (DELTASONE) 20 MG tablet 2 po daily x 4 days 8 tablet 0  . PROAIR HFA 108 (90 BASE) MCG/ACT inhaler Inhale 2 puffs into the lungs every 6 (six) hours as needed for wheezing or shortness of breath.     . simvastatin (ZOCOR) 40 MG tablet TAKE ONE TABLET BY MOUTH ONCE DAILY AT 6 PM 90 tablet 5  . sotalol (BETAPACE) 80 MG tablet TAKE ONE TABLET BY MOUTH TWICE DAILY 180 tablet 3  . triamterene-hydrochlorothiazide (MAXZIDE-25) 37.5-25 MG tablet TAKE ONE TABLET BY MOUTH ONCE DAILY 90 tablet 1   No current facility-administered medications for this visit.    Allergies:   Review of patient's allergies indicates no known allergies.    Social History:  The patient  reports that he has quit smoking. He has never used smokeless tobacco. He reports that he does not  drink alcohol or use illicit drugs.   Family History:  The patient's family history includes Alzheimer's disease in his mother; Cancer in his father; Lung cancer in his sister. There is no history of Heart attack, Hypertension, or Stroke.    ROS:  Please see the history of present illness.   Otherwise, review of systems are positive for recent [facility], improving.   All other systems are reviewed and negative.    PHYSICAL EXAM: VS:  BP 112/80 mmHg  Pulse 64  Ht 6\' 2"  (1.88 m)  Wt 150 lb (68.04 kg)  BMI 19.25 kg/m2 , BMI Body mass index is 19.25 kg/(m^2). GEN: Well nourished, well developed, in no acute distress HEENT: normal Neck: no JVD, carotid bruits, or masses Cardiac: RRR; no murmurs, rubs, or gallops,no edema  Respiratory:  clear to auscultation bilaterally, normal work of breathing GI: soft, nontender, nondistended, + BS MS: no deformity or atrophy Skin: warm and dry, no rash Neuro:  Strength and sensation are intact Psych: euthymic mood, full affect   EKG:   The ekg ordered today demonstrates NSR, bifascicular block   Recent Labs: 08/14/2015: BUN 22*; Creatinine, Ser 1.17; Hemoglobin 15.9; Platelets 195; Potassium 3.4*; Sodium 138   Lipid Panel    Component Value Date/Time   CHOL 170 06/21/2014 1049   TRIG 103.0 06/21/2014 1049   HDL 54.50 06/21/2014 1049   CHOLHDL 3 06/21/2014 1049   VLDL 20.6 06/21/2014 1049   LDLCALC 95 06/21/2014 1049     Other studies Reviewed: Additional studies/ records that were reviewed today with results demonstrating: .   ASSESSMENT AND PLAN:  1. CAD: stable. Off of anti platelet therapy due to being on anticoagulation.  No angina, even with his recent illness 2. AFib: maintaining sinus rhythm now. Continue current dose of sotalol. No further palpitations since a few days ago. Continue sotalol at the current dose. Eliquis for anticoagulation. We spoke about trying to limit alcohol intake due to the anticoagulation. He does admit to  having several drinks a week.  3. Hyperlipidemia: lipids well controlled. Continue current lipid-lowering therapy.  Obtain most recent labs from Dr. Eddie Dibbles office.  4. HTN:  well controlled. Continue current medicines.    Current medicines are reviewed at length with the patient today.  The patient concerns regarding his medicines were addressed.  The following changes have been made:  No change   Labs/ tests ordered today include:  No orders of the defined types were placed in this encounter.    Recommend 150 minutes/week of aerobic exercise Low fat, low carb, high fiber diet recommended  Disposition:   FU in 1  year   Signed, Larae Grooms, MD  08/16/2015 11:14 AM    [facility] [place], [place], [place]  [address] Phone: [phone]; Fax: [phone]

## 2015-08-16 NOTE — Patient Instructions (Signed)

## 2015-08-17 DIAGNOSIS — I480 Paroxysmal atrial fibrillation: Secondary | ICD-10-CM | POA: Diagnosis not present

## 2015-08-17 DIAGNOSIS — E876 Hypokalemia: Secondary | ICD-10-CM | POA: Diagnosis not present

## 2015-08-17 DIAGNOSIS — J45901 Unspecified asthma with (acute) exacerbation: Secondary | ICD-10-CM | POA: Diagnosis not present

## 2015-08-31 ENCOUNTER — Other Ambulatory Visit: Payer: Self-pay | Admitting: Interventional Cardiology

## 2015-09-12 ENCOUNTER — Ambulatory Visit: Payer: Medicare Other | Admitting: Interventional Cardiology

## 2015-09-22 ENCOUNTER — Other Ambulatory Visit: Payer: Self-pay | Admitting: Interventional Cardiology

## 2015-10-20 ENCOUNTER — Other Ambulatory Visit: Payer: Self-pay | Admitting: Interventional Cardiology

## 2015-10-26 ENCOUNTER — Ambulatory Visit: Payer: Medicare Other | Admitting: Interventional Cardiology

## 2015-11-08 ENCOUNTER — Telehealth: Payer: Self-pay | Admitting: Interventional Cardiology

## 2015-11-08 NOTE — Telephone Encounter (Signed)
Pt states for the last month or so he has had more frequent episodes of a fib. Pt states he had an episode this morning, lasting about an hour, associated with feeling a little dizzy. Pt states BP this morning was 90-100/60. Pt states he is taking Eliquis and has not missed any doses. Pt advised appt has been scheduled for him 11/09/15 10 AM in [facility]. Pt advised to stay well hydrated, be careful when changing positions.

## 2015-11-08 NOTE — Telephone Encounter (Signed)
New message    Wife calling verbalize her husband C/O AFIB episode since 8-9 this am.   Wife verbalize pulse is strong all over the place.   Wants to be seen today if possible

## 2015-11-09 ENCOUNTER — Ambulatory Visit (HOSPITAL_COMMUNITY)
Admission: RE | Admit: 2015-11-09 | Discharge: 2015-11-09 | Disposition: A | Discharge status: Home / Self Care | Payer: Medicare Other | Source: Ambulatory Visit | Attending: Nurse Practitioner | Admitting: Nurse Practitioner

## 2015-11-09 ENCOUNTER — Encounter (HOSPITAL_COMMUNITY): Payer: Self-pay | Admitting: Nurse Practitioner

## 2015-11-09 VITALS — BP 108/76 | Ht 74.0 in | Wt 151.6 lb

## 2015-11-09 DIAGNOSIS — Z818 Family history of other mental and behavioral disorders: Secondary | ICD-10-CM | POA: Insufficient documentation

## 2015-11-09 DIAGNOSIS — Z87891 Personal history of nicotine dependence: Secondary | ICD-10-CM | POA: Diagnosis not present

## 2015-11-09 DIAGNOSIS — Z79899 Other long term (current) drug therapy: Secondary | ICD-10-CM | POA: Insufficient documentation

## 2015-11-09 DIAGNOSIS — Z8546 Personal history of malignant neoplasm of prostate: Secondary | ICD-10-CM | POA: Insufficient documentation

## 2015-11-09 DIAGNOSIS — I480 Paroxysmal atrial fibrillation: Secondary | ICD-10-CM | POA: Insufficient documentation

## 2015-11-09 DIAGNOSIS — I452 Bifascicular block: Secondary | ICD-10-CM | POA: Insufficient documentation

## 2015-11-09 DIAGNOSIS — E782 Mixed hyperlipidemia: Secondary | ICD-10-CM | POA: Insufficient documentation

## 2015-11-09 DIAGNOSIS — Z9889 Other specified postprocedural states: Secondary | ICD-10-CM | POA: Diagnosis not present

## 2015-11-09 DIAGNOSIS — Z7901 Long term (current) use of anticoagulants: Secondary | ICD-10-CM | POA: Insufficient documentation

## 2015-11-09 DIAGNOSIS — Z801 Family history of malignant neoplasm of trachea, bronchus and lung: Secondary | ICD-10-CM | POA: Diagnosis not present

## 2015-11-09 DIAGNOSIS — I4891 Unspecified atrial fibrillation: Secondary | ICD-10-CM | POA: Diagnosis present

## 2015-11-09 DIAGNOSIS — Z8249 Family history of ischemic heart disease and other diseases of the circulatory system: Secondary | ICD-10-CM | POA: Insufficient documentation

## 2015-11-09 DIAGNOSIS — Z955 Presence of coronary angioplasty implant and graft: Secondary | ICD-10-CM | POA: Insufficient documentation

## 2015-11-09 DIAGNOSIS — I10 Essential (primary) hypertension: Secondary | ICD-10-CM | POA: Insufficient documentation

## 2015-11-09 DIAGNOSIS — Z823 Family history of stroke: Secondary | ICD-10-CM | POA: Insufficient documentation

## 2015-11-09 DIAGNOSIS — I4510 Unspecified right bundle-branch block: Secondary | ICD-10-CM | POA: Insufficient documentation

## 2015-11-09 NOTE — Progress Notes (Signed)
Patient ID: Vincent Hampton, male   DOB: Jul 26, 1940, 75 y.o.   MRN: [id number]    Primary Care Physician: Vincent Pac, MD Referring Physician: [facility] triage Cardiologist: Dr. George Hampton Vincent Hampton is a 75 y.o. male with a h/o PAF "for many years". He is in the afib clinic after having afib for 36 hours but finally converted to SR last night. He has been on sotalol for many years as well and the afib burden has been extremely low. But over the last 5 months he had seen more episodes. He recently saw his PCP within a few weeks ago and labs including TSH were done and normal, per pt. Echo has not been done in some time. Pt believes that yesterday, the afib may have been brought on by dehydration as he works full time and drinks very little water. After increasing his water intake yesterday, he did return to [place]. He does not drink alcohol, no excessive caffeine use, no snoring currently, no tobacco.   Today, he denies symptoms of palpitations, chest pain, shortness of breath, orthopnea, PND, lower extremity edema, dizziness, presyncope, syncope, or neurologic sequela. The patient is tolerating medications without difficulties and is otherwise without complaint today.   Past Medical History:  Diagnosis Date  . Atrial fibrillation ([place])   . Chest pain, unspecified   . Hypertension   . Intermediate coronary syndrome ([place])   . Mixed hyperlipidemia   . Nonspecific abnormal unspecified cardiovascular function study   . Prostate cancer [facility])    Past Surgical History:  Procedure Laterality Date  . HERNIA REPAIR    . PERCUTANEOUS CORONARY STENT INTERVENTION (PCI-S) N/A 05/02/2012   Procedure: PERCUTANEOUS CORONARY STENT INTERVENTION (PCI-S);  Surgeon: Vincent Booze, MD;  Location: [facility] CATH LAB;  Service: Cardiovascular;  Laterality: N/A;  . PROSTATECTOMY      Current Outpatient Prescriptions  Medication Sig Dispense Refill  . cetirizine (ZYRTEC) 10 MG tablet Take 10 mg by mouth  daily as needed for allergies.    . cholecalciferol (VITAMIN D) 1000 units tablet Take 1,000 Units by mouth daily.    . diphenhydrAMINE (BENADRYL ALLERGY) 25 MG tablet Take 25 mg by mouth every 6 (six) hours as needed for allergies.    [name] ELIQUIS 5 MG TABS tablet TAKE ONE TABLET BY MOUTH TWICE DAILY 60 tablet 10  . metoprolol tartrate (LOPRESSOR) 25 MG tablet TAKE ONE-HALF TABLET BY MOUTH TWICE DAILY 30 tablet 11  . Misc Natural Products (TURMERIC CURCUMIN) CAPS Take 3 capsules by mouth daily as needed (for inflammation).     . nitroGLYCERIN (NITROSTAT) 0.4 MG SL tablet Place 0.4 mg under the tongue every 5 (five) minutes as needed for chest pain (UP TO 3 DOSES BEFORE CALLING EMS).    . potassium chloride SA (K-DUR,KLOR-CON) 20 MEQ tablet Take 1 tablet (20 mEq total) by mouth daily. 5 tablet 0  . predniSONE (DELTASONE) 20 MG tablet 2 po daily x 4 days 8 tablet 0  . PROAIR HFA 108 (90 BASE) MCG/ACT inhaler Inhale 2 puffs into the lungs every 6 (six) hours as needed for wheezing or shortness of breath.     . simvastatin (ZOCOR) 40 MG tablet TAKE ONE TABLET BY MOUTH ONCE DAILY AT 6 PM 90 tablet 5  . sotalol (BETAPACE) 80 MG tablet TAKE ONE TABLET BY MOUTH TWICE DAILY 180 tablet 2  . triamterene-hydrochlorothiazide (MAXZIDE-25) 37.5-25 MG tablet TAKE ONE TABLET BY MOUTH ONCE DAILY 90 tablet 1  . allopurinol (ZYLOPRIM) 100 MG  tablet Take 100 mg by mouth daily as needed (GOUT).      No current facility-administered medications for this encounter.     No Known Allergies  Social History   Social History  . Marital status: Married    Spouse name: N/A  . Number of children: N/A  . Years of education: N/A   Occupational History  . Not on file.   Social History Main Topics  . Smoking status: Former Research scientist (life sciences)  . Smokeless tobacco: Never Used  . Alcohol use No  . Drug use: No  . Sexual activity: Not on file   Other Topics Concern  . Not on file   Social History Narrative  . No narrative on  file    Family History  Problem Relation Age of Onset  . Alzheimer's disease Mother   . Cancer Father   . Lung cancer Sister   . Heart attack Neg Hx   . Hypertension Neg Hx   . Stroke Neg Hx     ROS- All systems are reviewed and negative except as per the HPI above  Physical Exam: Vitals:   11/09/15 1009  BP: 108/76  Weight: 151 lb 9.6 oz (68.8 kg)  Height: 6\' 2"  (1.88 m)    GEN- The patient is well appearing, alert and oriented x 3 today.   Head- normocephalic, atraumatic Eyes-  Sclera clear, conjunctiva pink Ears- hearing intact Oropharynx- clear Neck- supple, no JVP Lymph- no cervical lymphadenopathy Lungs- Clear to ausculation bilaterally, normal work of breathing Heart- Regular rate and rhythm, no murmurs, rubs or gallops, PMI not laterally displaced GI- soft, NT, ND, + BS Extremities- no clubbing, cyanosis, or edema MS- no significant deformity or atrophy Skin- no rash or lesion Psych- euthymic mood, full affect Neuro- strength and sensation are intact  EKG-NSR at 69 bpm, LAD, RBBB, pr int 176 ms, qrs int 156 ms, qtc 497 ms Epic records reviewed  Assessment and Plan: 1.PAF Has returned to SR aafter 36 hours in afib Has done well on sotalol for some time but increased afib burden over the fast few months Discussed in detail his options going forward if afib continues to be persistent, amiodarone, tikosyn or ablation.  For now he wants to continue his current meds but will make sure to continue to hydrate well. Echo to be scheduled and will discuss with pt when results available and id if he would like to get appointment with Dr. Rayann Hampton to discuss ablation vrs change in antiarrythmic.   Geroge Baseman Maddilynn Esperanza, [facility] [address] [place], [place] [address] [phone]

## 2015-11-09 NOTE — Patient Instructions (Signed)
Butch Penny will be in touch with you after echo results

## 2015-11-17 ENCOUNTER — Ambulatory Visit (HOSPITAL_COMMUNITY)
Admission: RE | Admit: 2015-11-17 | Discharge: 2015-11-17 | Disposition: A | Discharge status: Home / Self Care | Payer: Medicare Other | Source: Ambulatory Visit | Attending: Nurse Practitioner | Admitting: Nurse Practitioner

## 2015-11-17 DIAGNOSIS — I4891 Unspecified atrial fibrillation: Secondary | ICD-10-CM | POA: Diagnosis present

## 2015-11-17 DIAGNOSIS — I351 Nonrheumatic aortic (valve) insufficiency: Secondary | ICD-10-CM | POA: Diagnosis not present

## 2015-11-17 DIAGNOSIS — I480 Paroxysmal atrial fibrillation: Secondary | ICD-10-CM | POA: Diagnosis not present

## 2015-11-17 DIAGNOSIS — I10 Essential (primary) hypertension: Secondary | ICD-10-CM | POA: Insufficient documentation

## 2015-11-17 DIAGNOSIS — E785 Hyperlipidemia, unspecified: Secondary | ICD-10-CM | POA: Diagnosis not present

## 2015-11-17 NOTE — Progress Notes (Signed)
  Echocardiogram 2D Echocardiogram has been performed.  Vincent Hampton 11/17/2015, 3:01 PM

## 2015-11-22 ENCOUNTER — Encounter: Payer: Self-pay | Admitting: Interventional Cardiology

## 2015-11-23 ENCOUNTER — Telehealth (HOSPITAL_COMMUNITY): Payer: Self-pay | Admitting: *Deleted

## 2015-11-23 NOTE — Telephone Encounter (Signed)
Pt notified of echo results.  Pt understood echo showed good pumping function and normal valves

## 2015-11-23 NOTE — Telephone Encounter (Signed)
-----   Message from Vincent Needs, NP sent at 11/22/2015  3:19 PM EDT ----- Please let the ot know the the pt know that the pump function/valves of the heart look good.

## 2015-12-28 ENCOUNTER — Other Ambulatory Visit: Payer: Self-pay | Admitting: Interventional Cardiology

## 2015-12-28 DIAGNOSIS — J309 Allergic rhinitis, unspecified: Secondary | ICD-10-CM | POA: Diagnosis not present

## 2015-12-28 DIAGNOSIS — J0100 Acute maxillary sinusitis, unspecified: Secondary | ICD-10-CM | POA: Diagnosis not present

## 2016-01-20 DIAGNOSIS — R2231 Localized swelling, mass and lump, right upper limb: Secondary | ICD-10-CM | POA: Diagnosis not present

## 2016-01-20 DIAGNOSIS — M25521 Pain in right elbow: Secondary | ICD-10-CM | POA: Diagnosis not present

## 2016-02-01 DIAGNOSIS — Z23 Encounter for immunization: Secondary | ICD-10-CM | POA: Diagnosis not present

## 2016-02-09 DIAGNOSIS — L814 Other melanin hyperpigmentation: Secondary | ICD-10-CM | POA: Diagnosis not present

## 2016-02-09 DIAGNOSIS — Z85828 Personal history of other malignant neoplasm of skin: Secondary | ICD-10-CM | POA: Diagnosis not present

## 2016-02-09 DIAGNOSIS — L821 Other seborrheic keratosis: Secondary | ICD-10-CM | POA: Diagnosis not present

## 2016-02-09 DIAGNOSIS — Z23 Encounter for immunization: Secondary | ICD-10-CM | POA: Diagnosis not present

## 2016-02-09 DIAGNOSIS — D1801 Hemangioma of skin and subcutaneous tissue: Secondary | ICD-10-CM | POA: Diagnosis not present

## 2016-02-09 DIAGNOSIS — D225 Melanocytic nevi of trunk: Secondary | ICD-10-CM | POA: Diagnosis not present

## 2016-03-05 ENCOUNTER — Other Ambulatory Visit: Payer: Self-pay | Admitting: Interventional Cardiology

## 2016-04-13 DIAGNOSIS — H26493 Other secondary cataract, bilateral: Secondary | ICD-10-CM | POA: Diagnosis not present

## 2016-04-13 DIAGNOSIS — J0100 Acute maxillary sinusitis, unspecified: Secondary | ICD-10-CM | POA: Diagnosis not present

## 2016-04-13 DIAGNOSIS — H35373 Puckering of macula, bilateral: Secondary | ICD-10-CM | POA: Diagnosis not present

## 2016-06-08 DIAGNOSIS — Z79899 Other long term (current) drug therapy: Secondary | ICD-10-CM | POA: Diagnosis not present

## 2016-06-08 DIAGNOSIS — M109 Gout, unspecified: Secondary | ICD-10-CM | POA: Diagnosis not present

## 2016-07-19 ENCOUNTER — Other Ambulatory Visit: Payer: Self-pay | Admitting: Interventional Cardiology

## 2016-07-31 ENCOUNTER — Encounter: Payer: Self-pay | Admitting: Interventional Cardiology

## 2016-08-15 NOTE — Progress Notes (Signed)
Patient ID: Vincent Hampton, male   DOB: Oct 26, 1940, 76 y.o.   MRN: [id number]     Cardiology Office Note   Date:  08/16/2016   ID:  Vincent Hampton, DOB 1941/03/14, MRN [id number]  PCP:  Hulan Fess, MD    Chief Complaint  Patient presents with  . Follow-up  f/u CAD   Wt Readings from Last 3 Encounters:  08/16/16 151 lb 3.2 oz (68.6 kg)  11/09/15 151 lb 9.6 oz (68.8 kg)  08/16/15 150 lb (68 kg)       History of Present Illness: Vincent Hampton is a 76 y.o. male  who has had PAF. He had a prox LAD stent in Jan 2014 for unstable angina sx. His anginal equivalent was left arm pain. This has resolved post stent.   Denies chest pain, SHOB, PND, palpitations, LE edema. Still working.  Has to climb ladders occasionally. No NTG use.   Walks regularly, 6000- 12,000 steps daily, with work.  Limited by gout flare earlier in 2018, but this resolved with medicine.  BP is well-controlled, SBP usually around 120s whenever he got it checked at his dr. appt. Does not check BPs at home. He has an O2 sat meter that is usually around 96%.   No bleeding, bruising has decreased. Limits his alcohol intake to one drink a day. He does exceed this on occasion.  No sx of AFib.  Flared in the past with bronchitis.  Mostly in NSR.   No bleeding issues with eliquis.    Past Medical History:  Diagnosis Date  . Atrial fibrillation ([place])   . Chest pain, unspecified   . Hypertension   . Intermediate coronary syndrome ([place])   . Mixed hyperlipidemia   . Nonspecific abnormal unspecified cardiovascular function study   . Prostate cancer [facility])     Past Surgical History:  Procedure Laterality Date  . HERNIA REPAIR    . PERCUTANEOUS CORONARY STENT INTERVENTION (PCI-S) N/A 05/02/2012   Procedure: PERCUTANEOUS CORONARY STENT INTERVENTION (PCI-S);  Surgeon: Jettie Booze, MD;  Location: [facility] CATH LAB;  Service: Cardiovascular;  Laterality: N/A;  . PROSTATECTOMY       Current Outpatient  Prescriptions  Medication Sig Dispense Refill  . allopurinol (ZYLOPRIM) 100 MG tablet Take 100 mg by mouth daily as needed (GOUT).     . cetirizine (ZYRTEC) 10 MG tablet Take 10 mg by mouth daily as needed for allergies.    . cholecalciferol (VITAMIN D) 1000 units tablet Take 1,000 Units by mouth daily.    . diphenhydrAMINE (BENADRYL ALLERGY) 25 MG tablet Take 25 mg by mouth every 6 (six) hours as needed for allergies.    [name] ELIQUIS 5 MG TABS tablet TAKE ONE TABLET BY MOUTH TWICE DAILY 60 tablet 10  . metoprolol tartrate (LOPRESSOR) 25 MG tablet TAKE ONE-HALF TABLET BY MOUTH TWICE DAILY 30 tablet 11  . Misc Natural Products (TURMERIC CURCUMIN) CAPS Take 3 capsules by mouth daily as needed (for inflammation).     . nitroGLYCERIN (NITROSTAT) 0.4 MG SL tablet Place 0.4 mg under the tongue every 5 (five) minutes as needed for chest pain (UP TO 3 DOSES BEFORE CALLING EMS).    . potassium chloride SA (K-DUR,KLOR-CON) 20 MEQ tablet Take 1 tablet (20 mEq total) by mouth daily. 5 tablet 0  . PROAIR HFA 108 (90 BASE) MCG/ACT inhaler Inhale 2 puffs into the lungs every 6 (six) hours as needed for wheezing or shortness of breath.     . simvastatin (  ZOCOR) 40 MG tablet TAKE ONE TABLET BY MOUTH ONCE DAILY AT 6PM 90 tablet 1  . sotalol (BETAPACE) 80 MG tablet TAKE ONE TABLET BY MOUTH TWICE DAILY 180 tablet 1  . triamterene-hydrochlorothiazide (MAXZIDE-25) 37.5-25 MG tablet TAKE ONE TABLET BY MOUTH ONCE DAILY 90 tablet 3   No current facility-administered medications for this visit.     Allergies:   Patient has no known allergies.    Social History:  The patient  reports that he has quit smoking. He has never used smokeless tobacco. He reports that he does not drink alcohol or use drugs.   Family History:  The patient's family history includes Alzheimer's disease in his mother; Cancer in his father; Lung cancer in his sister.    ROS:  Please see the history of present illness.   Otherwise, review of  systems are positive for recent [facility], improving.   All other systems are reviewed and negative.    PHYSICAL EXAM: VS:  BP 120/78   Pulse (!) 59   Ht 6\' 2"  (1.88 m)   Wt 151 lb 3.2 oz (68.6 kg)   BMI 19.41 kg/m  , BMI Body mass index is 19.41 kg/m. GEN: Well nourished, well developed, in no acute distress  HEENT: normal  Neck: no JVD, carotid bruits, or masses Cardiac: RRR; no murmurs, rubs, or gallops,no edema  Respiratory:  clear to auscultation bilaterally, normal work of breathing GI: soft, nontender, nondistended, + BS MS: no deformity or atrophy  Skin: warm and dry, no rash Neuro:  Strength and sensation are intact Psych: euthymic mood, full affect   EKG:   The ekg ordered today demonstrates NSR, bifascicular block- no change from 9/17   Recent Labs: No results found for requested labs within last 8760 hours.   Lipid Panel    Component Value Date/Time   CHOL 170 06/21/2014 1049   TRIG 103.0 06/21/2014 1049   HDL 54.50 06/21/2014 1049   CHOLHDL 3 06/21/2014 1049   VLDL 20.6 06/21/2014 1049   LDLCALC 95 06/21/2014 1049     Other studies Reviewed: Additional studies/ records that were reviewed today with results demonstrating: 2017 ECG reviewed.   ASSESSMENT AND PLAN:  1. CAD: stable. Off of anti platelet therapy due to being on anticoagulation.  Angina controlled, on medical therapy 2. AFib: No sx of palpitations recently. Continue current dose of sotalol.  Continue sotalol at the current dose. Eliquis for anticoagulation. We spoke about trying to limit alcohol intake due to the anticoagulation, and he does limit intake. 3. Hyperlipidemia: lipids well controlled. Continue current lipid-lowering therapy.  Followed by Dr. Eddie Dibbles office.  He was checked a few weeks ago when the gout flare happened. 4. HTN:  well controlled. Continue current medicines.    Current medicines are reviewed at length with the patient today.  The patient concerns regarding his  medicines were addressed.  The following changes have been made:  No change   Labs/ tests ordered today include:  No orders of the defined types were placed in this encounter.   Recommend 150 minutes/week of aerobic exercise Low fat, low carb, high fiber diet recommended  Disposition:   FU in 1 year   Signed, Larae Grooms, MD  08/16/2016 10:06 AM    [facility] [place], [place], [place]  [address] Phone: [phone]; Fax: [phone]

## 2016-08-16 ENCOUNTER — Ambulatory Visit (INDEPENDENT_AMBULATORY_CARE_PROVIDER_SITE_OTHER): Payer: Medicare Other | Admitting: Interventional Cardiology

## 2016-08-16 ENCOUNTER — Encounter: Payer: Self-pay | Admitting: Interventional Cardiology

## 2016-08-16 VITALS — BP 120/78 | HR 59 | Ht 74.0 in | Wt 151.2 lb

## 2016-08-16 DIAGNOSIS — I480 Paroxysmal atrial fibrillation: Secondary | ICD-10-CM

## 2016-08-16 DIAGNOSIS — I209 Angina pectoris, unspecified: Secondary | ICD-10-CM | POA: Diagnosis not present

## 2016-08-16 DIAGNOSIS — I10 Essential (primary) hypertension: Secondary | ICD-10-CM

## 2016-08-16 DIAGNOSIS — I25118 Atherosclerotic heart disease of native coronary artery with other forms of angina pectoris: Secondary | ICD-10-CM | POA: Diagnosis not present

## 2016-08-16 DIAGNOSIS — E782 Mixed hyperlipidemia: Secondary | ICD-10-CM

## 2016-08-16 MED ORDER — NITROGLYCERIN 0.4 MG SL SUBL: 0.4000 mg | SUBLINGUAL_TABLET | SUBLINGUAL | 1 refills | Status: DC | PRN

## 2016-08-16 NOTE — Patient Instructions (Signed)

## 2016-08-21 ENCOUNTER — Other Ambulatory Visit: Payer: Self-pay | Admitting: Interventional Cardiology

## 2016-08-22 ENCOUNTER — Other Ambulatory Visit: Payer: Medicare Other | Admitting: *Deleted

## 2016-08-22 ENCOUNTER — Other Ambulatory Visit: Payer: Self-pay | Admitting: *Deleted

## 2016-08-22 DIAGNOSIS — I4891 Unspecified atrial fibrillation: Secondary | ICD-10-CM

## 2016-08-23 LAB — BASIC METABOLIC PANEL
BUN/Creatinine Ratio: 20 (ref 10–24)
BUN: 23 mg/dL (ref 8–27)
CALCIUM: 9.2 mg/dL (ref 8.6–10.2)
CHLORIDE: 102 mmol/L (ref 96–106)
CO2: 26 mmol/L (ref 18–29)
Creatinine, Ser: 1.16 mg/dL (ref 0.76–1.27)
GFR calc Af Amer: 70 mL/min/{1.73_m2} (ref >59)
GFR calc non Af Amer: 60 mL/min/{1.73_m2} (ref >59)
Glucose: 118 mg/dL — ABNORMAL HIGH (ref 65–99)
Potassium: 3.5 mmol/L (ref 3.5–5.2)
Sodium: 143 mmol/L (ref 134–144)

## 2016-08-23 LAB — CBC
Hematocrit: 41.1 % (ref 37.5–51.0)
Hemoglobin: 14.2 g/dL (ref 13.0–17.7)
MCH: 31.3 pg (ref 26.6–33.0)
MCHC: 34.5 g/dL (ref 31.5–35.7)
MCV: 91 fL (ref 79–97)
PLATELETS: 222 10*3/uL (ref 150–379)
RBC: 4.54 x10E6/uL (ref 4.14–5.80)
RDW: 14 % (ref 12.3–15.4)
WBC: 5.8 10*3/uL (ref 3.4–10.8)

## 2016-08-30 ENCOUNTER — Other Ambulatory Visit: Payer: Self-pay | Admitting: Interventional Cardiology

## 2016-12-19 ENCOUNTER — Other Ambulatory Visit: Payer: Self-pay | Admitting: Interventional Cardiology

## 2017-01-18 ENCOUNTER — Other Ambulatory Visit: Payer: Self-pay | Admitting: Nurse Practitioner

## 2017-02-13 DIAGNOSIS — L821 Other seborrheic keratosis: Secondary | ICD-10-CM | POA: Diagnosis not present

## 2017-02-13 DIAGNOSIS — Z85828 Personal history of other malignant neoplasm of skin: Secondary | ICD-10-CM | POA: Diagnosis not present

## 2017-02-13 DIAGNOSIS — L814 Other melanin hyperpigmentation: Secondary | ICD-10-CM | POA: Diagnosis not present

## 2017-02-13 DIAGNOSIS — D225 Melanocytic nevi of trunk: Secondary | ICD-10-CM | POA: Diagnosis not present

## 2017-02-13 DIAGNOSIS — L309 Dermatitis, unspecified: Secondary | ICD-10-CM | POA: Diagnosis not present

## 2017-02-13 DIAGNOSIS — D1801 Hemangioma of skin and subcutaneous tissue: Secondary | ICD-10-CM | POA: Diagnosis not present

## 2017-02-13 DIAGNOSIS — Z23 Encounter for immunization: Secondary | ICD-10-CM | POA: Diagnosis not present

## 2017-02-19 IMAGING — DX DG CHEST 2V
2 series · 2 of 2 positions shown · non-contrast
Comparison: 04/24/2012

CLINICAL DATA: Cough and chest congestion.

EXAM:
CHEST  2 VIEW

[chest pa]
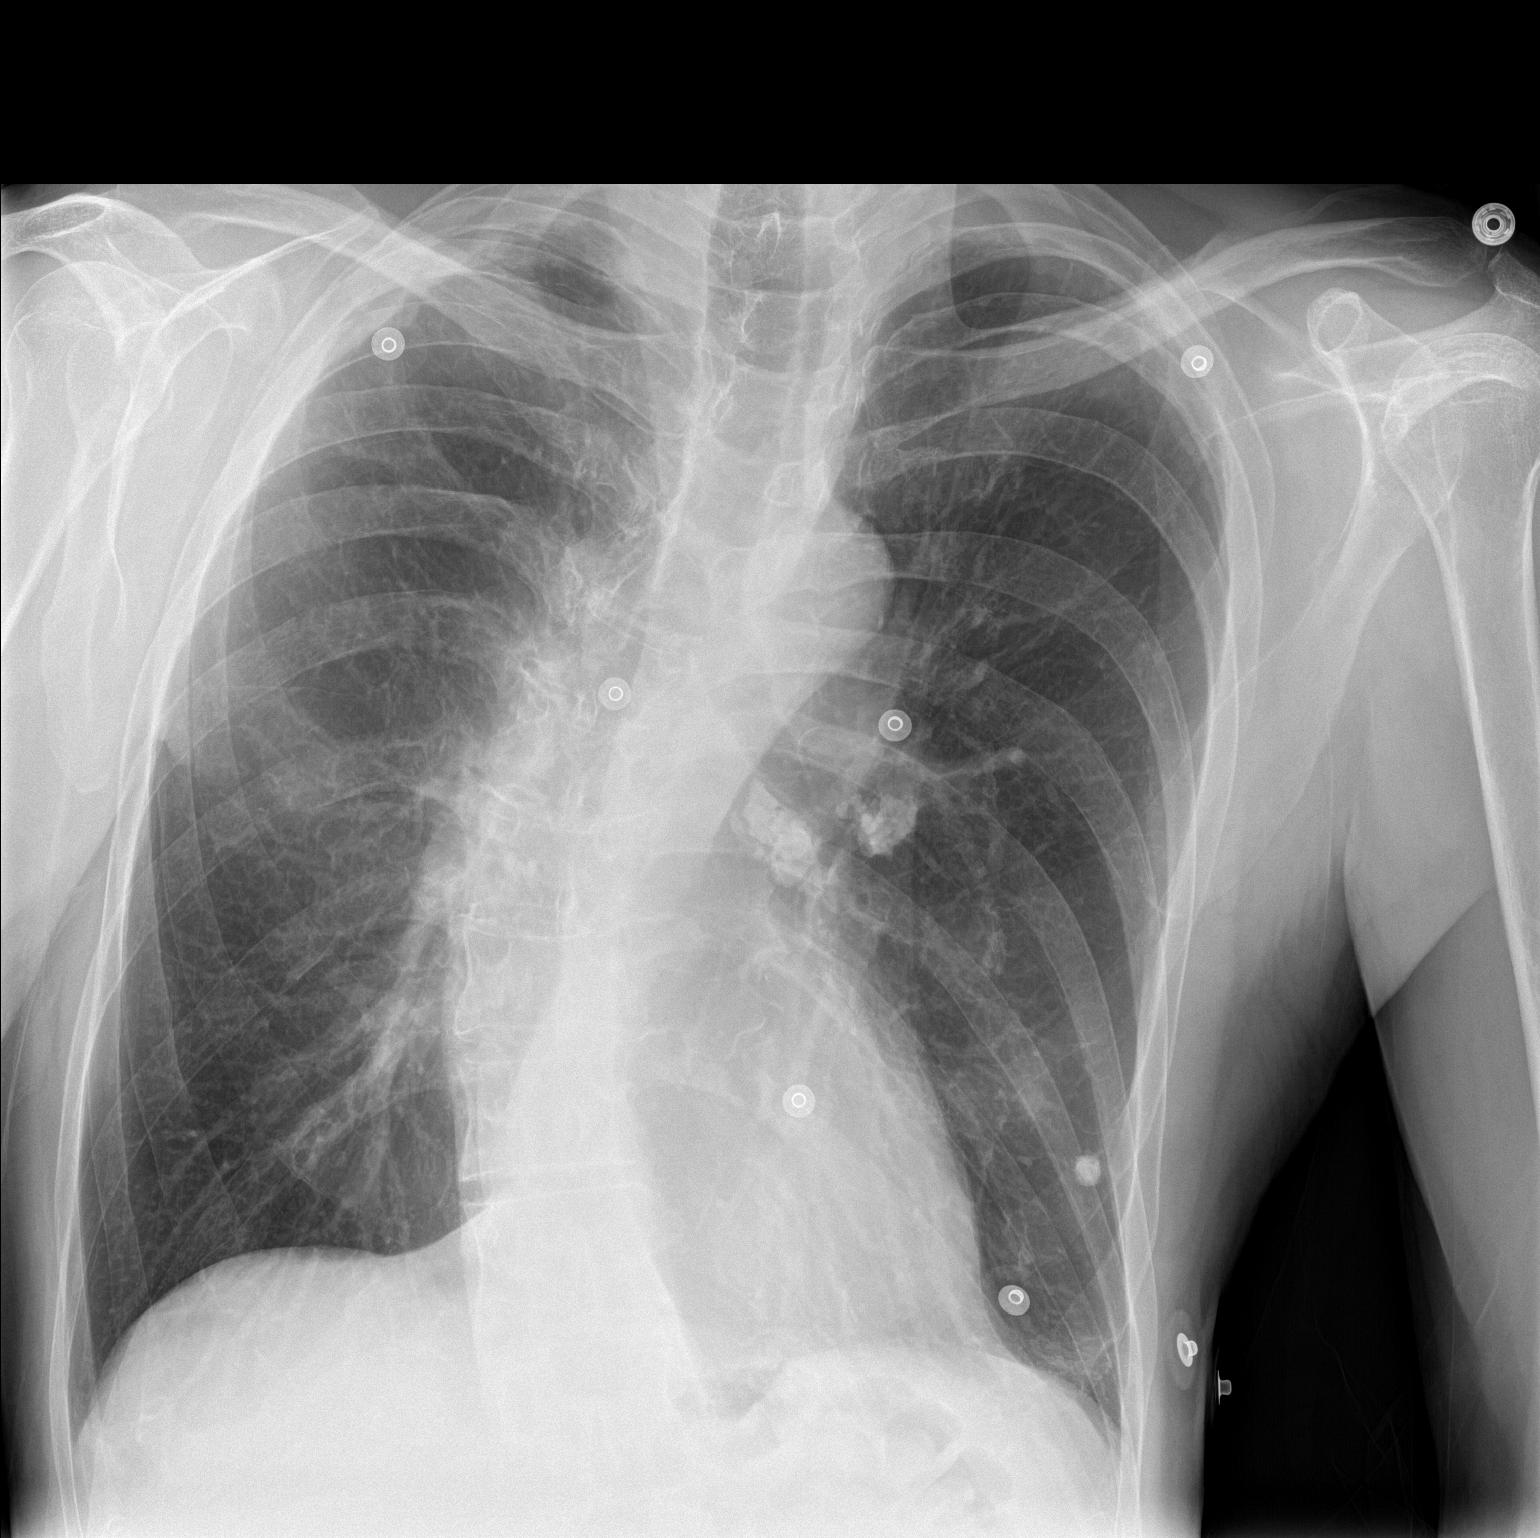

[chest lat]
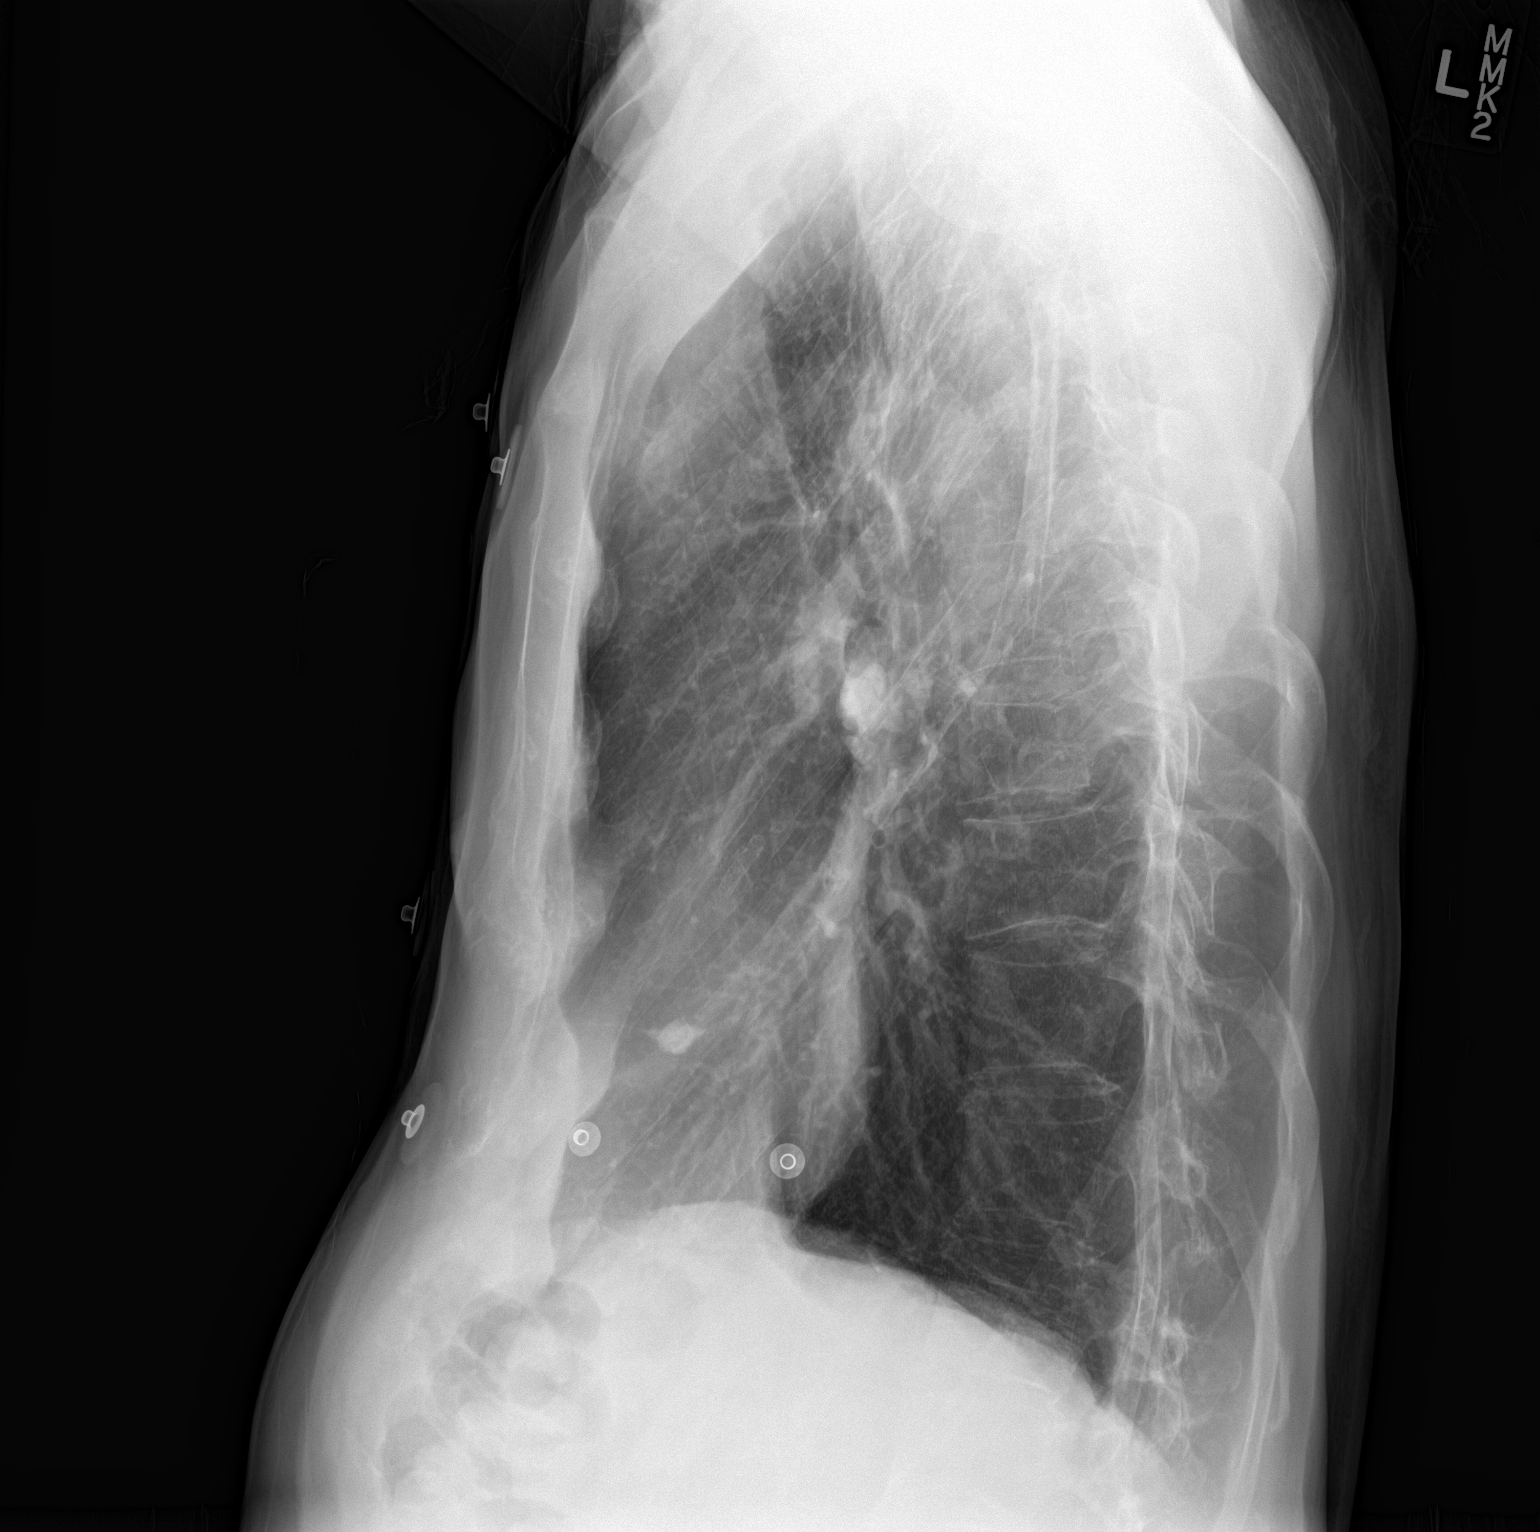

[2 of 2 positions shown; findings below may reference images not displayed]

FINDINGS: Heart size is normal. Spinal curvature convex to the right is again
demonstrated. Old Ghon complex with calcified granuloma in the
lingula and multiple calcified left hilar nodes as seen previously.
No evidence of active infiltrate, collapse or effusion.
IMPRESSION: No active process. Old granulomatous infection on the left. Chronic
spinal curvature.

## 2017-02-20 DIAGNOSIS — Z23 Encounter for immunization: Secondary | ICD-10-CM | POA: Diagnosis not present

## 2017-05-16 DIAGNOSIS — Z8739 Personal history of other diseases of the musculoskeletal system and connective tissue: Secondary | ICD-10-CM | POA: Diagnosis not present

## 2017-05-16 DIAGNOSIS — Z86010 Personal history of colonic polyps: Secondary | ICD-10-CM | POA: Diagnosis not present

## 2017-05-16 DIAGNOSIS — I480 Paroxysmal atrial fibrillation: Secondary | ICD-10-CM | POA: Diagnosis not present

## 2017-05-16 DIAGNOSIS — I2510 Atherosclerotic heart disease of native coronary artery without angina pectoris: Secondary | ICD-10-CM | POA: Diagnosis not present

## 2017-05-16 DIAGNOSIS — M545 Low back pain: Secondary | ICD-10-CM | POA: Diagnosis not present

## 2017-05-16 DIAGNOSIS — I10 Essential (primary) hypertension: Secondary | ICD-10-CM | POA: Diagnosis not present

## 2017-05-30 DIAGNOSIS — Z86010 Personal history of colonic polyps: Secondary | ICD-10-CM | POA: Diagnosis not present

## 2017-05-30 DIAGNOSIS — Z8739 Personal history of other diseases of the musculoskeletal system and connective tissue: Secondary | ICD-10-CM | POA: Diagnosis not present

## 2017-05-30 DIAGNOSIS — I2510 Atherosclerotic heart disease of native coronary artery without angina pectoris: Secondary | ICD-10-CM | POA: Diagnosis not present

## 2017-05-30 DIAGNOSIS — I10 Essential (primary) hypertension: Secondary | ICD-10-CM | POA: Diagnosis not present

## 2017-05-30 DIAGNOSIS — I480 Paroxysmal atrial fibrillation: Secondary | ICD-10-CM | POA: Diagnosis not present

## 2017-05-30 DIAGNOSIS — E782 Mixed hyperlipidemia: Secondary | ICD-10-CM | POA: Diagnosis not present

## 2017-06-13 DIAGNOSIS — M545 Low back pain: Secondary | ICD-10-CM | POA: Diagnosis not present

## 2017-06-13 DIAGNOSIS — M47816 Spondylosis without myelopathy or radiculopathy, lumbar region: Secondary | ICD-10-CM | POA: Diagnosis not present

## 2017-07-01 DIAGNOSIS — M6281 Muscle weakness (generalized): Secondary | ICD-10-CM | POA: Diagnosis not present

## 2017-07-01 DIAGNOSIS — M545 Low back pain: Secondary | ICD-10-CM | POA: Diagnosis not present

## 2017-07-01 DIAGNOSIS — R2689 Other abnormalities of gait and mobility: Secondary | ICD-10-CM | POA: Diagnosis not present

## 2017-07-11 DIAGNOSIS — L309 Dermatitis, unspecified: Secondary | ICD-10-CM | POA: Diagnosis not present

## 2017-07-11 DIAGNOSIS — R238 Other skin changes: Secondary | ICD-10-CM | POA: Diagnosis not present

## 2017-07-17 ENCOUNTER — Other Ambulatory Visit: Payer: Self-pay | Admitting: Interventional Cardiology

## 2017-08-14 ENCOUNTER — Other Ambulatory Visit: Payer: Self-pay | Admitting: Interventional Cardiology

## 2017-08-15 NOTE — Telephone Encounter (Signed)
Age 77 years WT 68.6kg  08/16/2016 Saw Dr Irish Lack 05/17 2018 Return appt to see Dr Irish Lack 10/01/2017 08/22/2016 SrCr 1.16 08/22/2016 Hgb 14.2 HCT 41.1 Refill for Eliquis 5mg  q 12 hours done as requested

## 2017-08-22 ENCOUNTER — Ambulatory Visit: Payer: Medicare Other | Admitting: Interventional Cardiology

## 2017-08-28 ENCOUNTER — Other Ambulatory Visit: Payer: Self-pay | Admitting: Interventional Cardiology

## 2017-08-30 ENCOUNTER — Other Ambulatory Visit: Payer: Self-pay | Admitting: Interventional Cardiology

## 2017-09-03 NOTE — Progress Notes (Signed)
Cardiology Office Note   Date:  09/04/2017   ID:  Vincent Hampton, DOB [birth year], MRN [id number]  PCP:  Hulan Fess, MD    No chief complaint on file.  CAD  Wt Readings from Last 3 Encounters:  09/04/17 144 lb (65.3 kg)  08/16/16 151 lb 3.2 oz (68.6 kg)  11/09/15 151 lb 9.6 oz (68.8 kg)       History of Present Illness: Vincent Hampton is a 77 y.o. male  who has had PAF. He had a prox LAD stent in Jan 2014 for unstable angina sx. His anginal equivalent was left arm pain. This has resolved post stent.  No AFib sx.  Denies : Chest pain. Dizziness. Leg edema. Nitroglycerin use. Orthopnea. Palpitations. Paroxysmal nocturnal dyspnea. Shortness of breath. Syncope.   He is still active at work.  He recently moved furniture without difficulty.    Past Medical History:  Diagnosis Date  . Atrial fibrillation ([place])   . Chest pain, unspecified   . Hypertension   . Intermediate coronary syndrome ([place])   . Mixed hyperlipidemia   . Nonspecific abnormal unspecified cardiovascular function study   . Prostate cancer [facility])     Past Surgical History:  Procedure Laterality Date  . HERNIA REPAIR    . PERCUTANEOUS CORONARY STENT INTERVENTION (PCI-S) N/A 05/02/2012   Procedure: PERCUTANEOUS CORONARY STENT INTERVENTION (PCI-S);  Surgeon: Jettie Booze, MD;  Location: [facility] CATH LAB;  Service: Cardiovascular;  Laterality: N/A;  . PROSTATECTOMY       Current Outpatient Medications  Medication Sig Dispense Refill  . allopurinol (ZYLOPRIM) 100 MG tablet Take 100 mg by mouth daily as needed (GOUT).     . cetirizine (ZYRTEC) 10 MG tablet Take 10 mg by mouth daily as needed for allergies.    . cholecalciferol (VITAMIN D) 1000 units tablet Take 1,000 Units by mouth daily.    . diphenhydrAMINE (BENADRYL ALLERGY) 25 MG tablet Take 25 mg by mouth every 6 (six) hours as needed for allergies.    [name] ELIQUIS 5 MG TABS tablet TAKE 1 TABLET BY MOUTH TWICE DAILY 180 tablet 1  . metoprolol  tartrate (LOPRESSOR) 25 MG tablet TAKE 1/2 (ONE-HALF) TABLET BY MOUTH TWICE DAILY 90 tablet 0  . Misc Natural Products (TURMERIC CURCUMIN) CAPS Take 3 capsules by mouth daily as needed (for inflammation).     . nitroGLYCERIN (NITROSTAT) 0.4 MG SL tablet Place 1 tablet (0.4 mg total) under the tongue every 5 (five) minutes as needed for chest pain (UP TO 3 DOSES BEFORE CALLING EMS). 25 tablet 1  . potassium chloride SA (K-DUR,KLOR-CON) 20 MEQ tablet Take 1 tablet (20 mEq total) by mouth daily. 5 tablet 0  . PROAIR HFA 108 (90 BASE) MCG/ACT inhaler Inhale 2 puffs into the lungs every 6 (six) hours as needed for wheezing or shortness of breath.     . simvastatin (ZOCOR) 40 MG tablet TAKE 1 TABLET BY MOUTH ONCE DAILY AT 6PM 90 tablet 0  . sotalol (BETAPACE) 80 MG tablet Take 1 tablet (80 mg total) by mouth 2 (two) times daily. Please keep 5/23 appointment for additional refills thanks. 60 tablet 1  . triamterene-hydrochlorothiazide (MAXZIDE-25) 37.5-25 MG tablet TAKE 1 TABLET BY MOUTH ONCE DAILY 90 tablet 0   No current facility-administered medications for this visit.     Allergies:   Patient has no known allergies.    Social History:  The patient  reports that he has quit smoking. He has never used smokeless  tobacco. He reports that he does not drink alcohol or use drugs.   Family History:  The patient's family history includes Alzheimer's disease in his mother; Cancer in his father; Lung cancer in his sister.    ROS:  Please see the history of present illness.   Otherwise, review of systems are positive for weight loss since he decreased alcohol intake.   All other systems are reviewed and negative.    PHYSICAL EXAM: VS:  BP 108/72   Pulse (!) 55   Ht 6\' 2"  (1.88 m)   Wt 144 lb (65.3 kg)   SpO2 96%   BMI 18.49 kg/m  , BMI Body mass index is 18.49 kg/m. GEN: Well nourished, well developed, in no acute distress  HEENT: normal  Neck: no JVD, carotid bruits, or masses Cardiac: RRR; no  murmurs, rubs, or gallops,no edema  Respiratory:  clear to auscultation bilaterally, normal work of breathing GI: soft, nontender, nondistended, + BS MS: no deformity or atrophy  Skin: warm and dry, no rash Neuro:  Strength and sensation are intact Psych: euthymic mood, full affect   EKG:   The ekg ordered today demonstrates sinus bradycardia, right bundle branch block, left anterior fascicular block, no significant change from 2018 ECG   Recent Labs: No results found for requested labs within last 8760 hours.   Lipid Panel    Component Value Date/Time   CHOL 170 06/21/2014 1049   TRIG 103.0 06/21/2014 1049   HDL 54.50 06/21/2014 1049   CHOLHDL 3 06/21/2014 1049   VLDL 20.6 06/21/2014 1049   LDLCALC 95 06/21/2014 1049     Other studies Reviewed: Additional studies/ records that were reviewed today with results demonstrating: Prior ECG reviewed.  Labs reviewed.   ASSESSMENT AND PLAN:  1. CAD: No angina on medical therapy.  No antiplatelet therapy due to being on Eliquis.  Tolerating exertion without difficulty. 2. AFib: Maintaining NSR.  Eliquis for stroke prevention.  No bleeding problems.  If his weight was to further drop below 60 kg, would have to consider the lower dose of Eliquis.  DOing well on Sotalol. 3. Hyperlipidemia: LDL 72 in 2/19.  COntinue simvastatin.  4. HTN: Well controlled. COntinue current meds.   This patients CHA2DS2-VASc Score and unadjusted Ischemic Stroke Rate (% per year) is equal to 3.2 % stroke rate/year from a score of 3  Above score calculated as 1 point each if present [CHF, HTN, DM, Vascular=MI/PAD/Aortic Plaque, Age if 65-74, or Male] Above score calculated as 2 points each if present [Age > 75, or Stroke/TIA/TE]     Current medicines are reviewed at length with the patient today.  The patient concerns regarding his medicines were addressed. The following changes have been made:  No change  Labs/ tests ordered today include:  No  orders of the defined types were placed in this encounter.   Recommend 150 minutes/week of aerobic exercise Low fat, low carb, high fiber diet recommended  Disposition:   FU in 1 year   Signed, Larae Grooms, MD  09/04/2017 8:51 AM    [facility] [place], [place], [place]  [address] Phone: [phone]; Fax: [phone]

## 2017-09-04 ENCOUNTER — Encounter: Payer: Self-pay | Admitting: Interventional Cardiology

## 2017-09-04 ENCOUNTER — Other Ambulatory Visit: Payer: Self-pay | Admitting: Interventional Cardiology

## 2017-09-04 ENCOUNTER — Ambulatory Visit (INDEPENDENT_AMBULATORY_CARE_PROVIDER_SITE_OTHER): Payer: Medicare Other | Admitting: Interventional Cardiology

## 2017-09-04 VITALS — BP 108/72 | HR 55 | Ht 74.0 in | Wt 144.0 lb

## 2017-09-04 DIAGNOSIS — I480 Paroxysmal atrial fibrillation: Secondary | ICD-10-CM

## 2017-09-04 DIAGNOSIS — E782 Mixed hyperlipidemia: Secondary | ICD-10-CM

## 2017-09-04 DIAGNOSIS — I25118 Atherosclerotic heart disease of native coronary artery with other forms of angina pectoris: Secondary | ICD-10-CM

## 2017-09-04 DIAGNOSIS — I10 Essential (primary) hypertension: Secondary | ICD-10-CM | POA: Diagnosis not present

## 2017-09-04 NOTE — Patient Instructions (Signed)

## 2017-09-26 DIAGNOSIS — H26492 Other secondary cataract, left eye: Secondary | ICD-10-CM | POA: Diagnosis not present

## 2017-09-27 DIAGNOSIS — R05 Cough: Secondary | ICD-10-CM | POA: Diagnosis not present

## 2017-09-27 DIAGNOSIS — J069 Acute upper respiratory infection, unspecified: Secondary | ICD-10-CM | POA: Diagnosis not present

## 2017-10-01 ENCOUNTER — Ambulatory Visit: Payer: Medicare Other | Admitting: Interventional Cardiology

## 2017-10-31 DIAGNOSIS — H53483 Generalized contraction of visual field, bilateral: Secondary | ICD-10-CM | POA: Diagnosis not present

## 2017-10-31 DIAGNOSIS — H02834 Dermatochalasis of left upper eyelid: Secondary | ICD-10-CM | POA: Diagnosis not present

## 2017-10-31 DIAGNOSIS — H0279 Other degenerative disorders of eyelid and periocular area: Secondary | ICD-10-CM | POA: Diagnosis not present

## 2017-10-31 DIAGNOSIS — H02423 Myogenic ptosis of bilateral eyelids: Secondary | ICD-10-CM | POA: Diagnosis not present

## 2017-10-31 DIAGNOSIS — H57813 Brow ptosis, bilateral: Secondary | ICD-10-CM | POA: Diagnosis not present

## 2017-10-31 DIAGNOSIS — H02831 Dermatochalasis of right upper eyelid: Secondary | ICD-10-CM | POA: Diagnosis not present

## 2017-10-31 DIAGNOSIS — H02413 Mechanical ptosis of bilateral eyelids: Secondary | ICD-10-CM | POA: Diagnosis not present

## 2017-11-06 ENCOUNTER — Encounter

## 2017-11-06 ENCOUNTER — Ambulatory Visit: Payer: Medicare Other | Admitting: Interventional Cardiology

## 2017-11-28 ENCOUNTER — Other Ambulatory Visit: Payer: Self-pay | Admitting: Interventional Cardiology

## 2017-12-12 ENCOUNTER — Other Ambulatory Visit: Payer: Self-pay | Admitting: Interventional Cardiology

## 2017-12-26 DIAGNOSIS — H02059 Trichiasis without entropian unspecified eye, unspecified eyelid: Secondary | ICD-10-CM | POA: Diagnosis not present

## 2017-12-26 DIAGNOSIS — H02055 Trichiasis without entropian left lower eyelid: Secondary | ICD-10-CM | POA: Diagnosis not present

## 2017-12-26 DIAGNOSIS — H02052 Trichiasis without entropian right lower eyelid: Secondary | ICD-10-CM | POA: Diagnosis not present

## 2017-12-26 DIAGNOSIS — H02831 Dermatochalasis of right upper eyelid: Secondary | ICD-10-CM | POA: Diagnosis not present

## 2017-12-26 DIAGNOSIS — H02413 Mechanical ptosis of bilateral eyelids: Secondary | ICD-10-CM | POA: Diagnosis not present

## 2017-12-26 DIAGNOSIS — H53483 Generalized contraction of visual field, bilateral: Secondary | ICD-10-CM | POA: Diagnosis not present

## 2017-12-26 DIAGNOSIS — H57813 Brow ptosis, bilateral: Secondary | ICD-10-CM | POA: Diagnosis not present

## 2017-12-26 DIAGNOSIS — H0279 Other degenerative disorders of eyelid and periocular area: Secondary | ICD-10-CM | POA: Diagnosis not present

## 2017-12-26 DIAGNOSIS — H02423 Myogenic ptosis of bilateral eyelids: Secondary | ICD-10-CM | POA: Diagnosis not present

## 2017-12-26 DIAGNOSIS — H02834 Dermatochalasis of left upper eyelid: Secondary | ICD-10-CM | POA: Diagnosis not present

## 2018-01-10 ENCOUNTER — Telehealth: Payer: Self-pay | Admitting: *Deleted

## 2018-01-10 NOTE — Telephone Encounter (Signed)
   Primary Cardiologist: Larae Grooms, MD  Chart reviewed as part of pre-operative protocol coverage. Patient was contacted 01/10/2018 in reference to pre-operative risk assessment for pending surgery as outlined below.  Vincent Hampton was last seen on 09/04/17 by Dr. Irish Lack.  Since that day, Vincent Hampton has done well without chest pain and able to be active to 4 METS.[name]  Therefore, based on ACC/AHA guidelines, the patient would be at acceptable risk for the planned procedure without further cardiovascular testing.  See attached for Eliquis recommendations.  I will route this recommendation to the requesting party via Epic fax function and remove from pre-op pool.  Please call with questions.  Cecilie Kicks, NP 01/10/2018, 4:54 PM

## 2018-01-10 NOTE — Telephone Encounter (Signed)
Pharm please address thanks 

## 2018-01-10 NOTE — Telephone Encounter (Signed)
Patient with diagnosis of Afib on Eliquis for anticoagulation.    Procedure: blepharoplasty Date of procedure: 01/20/18  CHADS2-VASc score of  4 (CHF, HTN, AGE, DM2, stroke/tia x 2, CAD, AGE, male)  CrCl 5ml/min  Per office protocol, patient can hold Eliquis for 24 hours prior to procedure.

## 2018-01-10 NOTE — Telephone Encounter (Signed)
   [facility] Pre-operative Risk Assessment    Request for surgical clearance:  1. What type of surgery is being performed? Bilateral upper blepharoplasty with tarsoelevator repair and bilateral lower entropion repair   2. When is this surgery scheduled? 01/20/18   3. What type of clearance is required (medical clearance vs. Pharmacy clearance to hold med vs. Both)? Both  4. Are there any medications that need to be held prior to surgery and how long? eliquis   5. Practice name and name of physician performing surgery? [facility]   6. What is your office phone number: [phone]    7.   What is your office fax number: [phone]  4.   Anesthesia type (None, local, MAC, general) ? unknown   Austin Miles 01/10/2018, 2:25 PM  _________________________________________________________________   (provider comments below)

## 2018-01-20 DIAGNOSIS — H57813 Brow ptosis, bilateral: Secondary | ICD-10-CM | POA: Diagnosis not present

## 2018-01-20 DIAGNOSIS — H02055 Trichiasis without entropian left lower eyelid: Secondary | ICD-10-CM | POA: Diagnosis not present

## 2018-01-20 DIAGNOSIS — H0279 Other degenerative disorders of eyelid and periocular area: Secondary | ICD-10-CM | POA: Diagnosis not present

## 2018-01-20 DIAGNOSIS — H02535 Eyelid retraction left lower eyelid: Secondary | ICD-10-CM | POA: Diagnosis not present

## 2018-01-20 DIAGNOSIS — H02035 Senile entropion of left lower eyelid: Secondary | ICD-10-CM | POA: Diagnosis not present

## 2018-01-20 DIAGNOSIS — L821 Other seborrheic keratosis: Secondary | ICD-10-CM | POA: Diagnosis not present

## 2018-01-20 DIAGNOSIS — H02532 Eyelid retraction right lower eyelid: Secondary | ICD-10-CM | POA: Diagnosis not present

## 2018-01-20 DIAGNOSIS — H02032 Senile entropion of right lower eyelid: Secondary | ICD-10-CM | POA: Diagnosis not present

## 2018-01-20 DIAGNOSIS — H02413 Mechanical ptosis of bilateral eyelids: Secondary | ICD-10-CM | POA: Diagnosis not present

## 2018-01-20 DIAGNOSIS — H02834 Dermatochalasis of left upper eyelid: Secondary | ICD-10-CM | POA: Diagnosis not present

## 2018-01-20 DIAGNOSIS — H02831 Dermatochalasis of right upper eyelid: Secondary | ICD-10-CM | POA: Diagnosis not present

## 2018-01-20 DIAGNOSIS — H02423 Myogenic ptosis of bilateral eyelids: Secondary | ICD-10-CM | POA: Diagnosis not present

## 2018-01-20 DIAGNOSIS — H53483 Generalized contraction of visual field, bilateral: Secondary | ICD-10-CM | POA: Diagnosis not present

## 2018-02-10 ENCOUNTER — Other Ambulatory Visit: Payer: Self-pay | Admitting: Interventional Cardiology

## 2018-02-19 DIAGNOSIS — I872 Venous insufficiency (chronic) (peripheral): Secondary | ICD-10-CM | POA: Diagnosis not present

## 2018-02-19 DIAGNOSIS — D485 Neoplasm of uncertain behavior of skin: Secondary | ICD-10-CM | POA: Diagnosis not present

## 2018-02-19 DIAGNOSIS — Z85828 Personal history of other malignant neoplasm of skin: Secondary | ICD-10-CM | POA: Diagnosis not present

## 2018-02-19 DIAGNOSIS — D225 Melanocytic nevi of trunk: Secondary | ICD-10-CM | POA: Diagnosis not present

## 2018-02-19 DIAGNOSIS — L309 Dermatitis, unspecified: Secondary | ICD-10-CM | POA: Diagnosis not present

## 2018-02-19 DIAGNOSIS — L814 Other melanin hyperpigmentation: Secondary | ICD-10-CM | POA: Diagnosis not present

## 2018-02-19 DIAGNOSIS — L821 Other seborrheic keratosis: Secondary | ICD-10-CM | POA: Diagnosis not present

## 2018-02-19 DIAGNOSIS — L570 Actinic keratosis: Secondary | ICD-10-CM | POA: Diagnosis not present

## 2018-02-19 DIAGNOSIS — Z23 Encounter for immunization: Secondary | ICD-10-CM | POA: Diagnosis not present

## 2018-05-26 DIAGNOSIS — H02831 Dermatochalasis of right upper eyelid: Secondary | ICD-10-CM | POA: Diagnosis not present

## 2018-05-26 DIAGNOSIS — Z09 Encounter for follow-up examination after completed treatment for conditions other than malignant neoplasm: Secondary | ICD-10-CM | POA: Diagnosis not present

## 2018-05-26 DIAGNOSIS — H04129 Dry eye syndrome of unspecified lacrimal gland: Secondary | ICD-10-CM | POA: Diagnosis not present

## 2018-05-26 DIAGNOSIS — H02834 Dermatochalasis of left upper eyelid: Secondary | ICD-10-CM | POA: Diagnosis not present

## 2018-05-28 DIAGNOSIS — L309 Dermatitis, unspecified: Secondary | ICD-10-CM | POA: Diagnosis not present

## 2018-05-28 DIAGNOSIS — Z23 Encounter for immunization: Secondary | ICD-10-CM | POA: Diagnosis not present

## 2018-06-20 DIAGNOSIS — I10 Essential (primary) hypertension: Secondary | ICD-10-CM | POA: Diagnosis not present

## 2018-06-20 DIAGNOSIS — I480 Paroxysmal atrial fibrillation: Secondary | ICD-10-CM | POA: Diagnosis not present

## 2018-06-20 DIAGNOSIS — I2510 Atherosclerotic heart disease of native coronary artery without angina pectoris: Secondary | ICD-10-CM | POA: Diagnosis not present

## 2018-06-20 DIAGNOSIS — Z8739 Personal history of other diseases of the musculoskeletal system and connective tissue: Secondary | ICD-10-CM | POA: Diagnosis not present

## 2018-06-20 DIAGNOSIS — H26492 Other secondary cataract, left eye: Secondary | ICD-10-CM | POA: Diagnosis not present

## 2018-06-20 DIAGNOSIS — Z681 Body mass index (BMI) 19 or less, adult: Secondary | ICD-10-CM | POA: Diagnosis not present

## 2018-06-20 DIAGNOSIS — Z86010 Personal history of colonic polyps: Secondary | ICD-10-CM | POA: Diagnosis not present

## 2018-06-20 DIAGNOSIS — R7301 Impaired fasting glucose: Secondary | ICD-10-CM | POA: Diagnosis not present

## 2018-06-25 DIAGNOSIS — I10 Essential (primary) hypertension: Secondary | ICD-10-CM | POA: Diagnosis not present

## 2018-06-25 DIAGNOSIS — R7301 Impaired fasting glucose: Secondary | ICD-10-CM | POA: Diagnosis not present

## 2018-06-25 DIAGNOSIS — Z8739 Personal history of other diseases of the musculoskeletal system and connective tissue: Secondary | ICD-10-CM | POA: Diagnosis not present

## 2018-07-29 ENCOUNTER — Telehealth: Payer: Self-pay | Admitting: *Deleted

## 2018-07-29 ENCOUNTER — Encounter: Payer: Self-pay | Admitting: *Deleted

## 2018-07-29 NOTE — Telephone Encounter (Deleted)
Received call from patient stating he did not get email for [email or web address] visit. I resent e mail and wife stated it was asking for a password to open e mail.  I tried to text [email or web address] to patient but it failed to deliver. His wife stated he has used webex for other dr visits, and he has webex e mail for his appt tomorrow. The patient prefers to use webex tomorrow.  Left his appt as a Webex visit.

## 2018-07-31 ENCOUNTER — Ambulatory Visit (INDEPENDENT_AMBULATORY_CARE_PROVIDER_SITE_OTHER): Payer: Medicare Other | Admitting: Diagnostic Neuroimaging

## 2018-07-31 ENCOUNTER — Other Ambulatory Visit: Payer: Self-pay

## 2018-07-31 ENCOUNTER — Encounter: Payer: Self-pay | Admitting: Diagnostic Neuroimaging

## 2018-07-31 ENCOUNTER — Telehealth: Payer: Self-pay | Admitting: Diagnostic Neuroimaging

## 2018-07-31 DIAGNOSIS — H532 Diplopia: Secondary | ICD-10-CM | POA: Diagnosis not present

## 2018-07-31 NOTE — Telephone Encounter (Signed)
Medicare/bcbs supp order sent to GI. No auth they will reach out to the pt to schedule.  °

## 2018-07-31 NOTE — Progress Notes (Addendum)
[facility]  PATIENT: Vincent Hampton DOB: Apr 15, 1940  REFERRING CLINICIAN: Delman Cheadle HISTORY FROM: patient  REASON FOR VISIT: new consult    HISTORICAL  CHIEF COMPLAINT:  Chief Complaint  Patient presents with  . Eye Problem  . Diplopia    HISTORY OF PRESENT ILLNESS:   78 year old male here for evaluation of double vision.  For past 1 month patient has noticed vertical double vision, worse when he tilts his head towards the right side tilts his head off.  Symptoms improve if he tilts his head down tilts his head left side.  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  Over the past 1 month symptoms have been gradually improving.  Symptoms are present with both eyes open and resolved closes either the right or left eye.  No prodromal injuries accidents or traumas.  No fluctuation in symptoms.  No ptosis.  No slurred speech or trouble swallowing.  No problems with arms hands or legs.  No headaches.   REVIEW OF SYSTEMS: Full 14 system review of systems performed and negative with exception of: as per HPI  ALLERGIES: No Known Allergies  HOME MEDICATIONS: Outpatient Medications Prior to Visit  Medication Sig Dispense Refill  . allopurinol (ZYLOPRIM) 100 MG tablet Take 100 mg by mouth daily as needed (GOUT).     . cholecalciferol (VITAMIN D) 1000 units tablet Take 1,000 Units by mouth daily.    [name] ELIQUIS 5 MG TABS tablet TAKE 1 TABLET BY MOUTH TWICE DAILY 180 tablet 1  . metoprolol tartrate (LOPRESSOR) 25 MG tablet TAKE 1/2 (ONE-HALF) TABLET BY MOUTH TWICE DAILY 90 tablet 3  . nitroGLYCERIN (NITROSTAT) 0.4 MG SL tablet Place 1 tablet (0.4 mg total) under the tongue every 5 (five) minutes as needed for chest pain (UP TO 3 DOSES BEFORE CALLING EMS). (Patient not taking: Reported on 07/29/2018) 25 tablet 1  . PROAIR HFA 108 (90 BASE) MCG/ACT inhaler Inhale 2 puffs into the lungs every 6 (six) hours as needed for wheezing  or shortness of breath.     . simvastatin (ZOCOR) 40 MG tablet TAKE 1 TABLET BY MOUTH ONCE DAILY AT  6PM 90 tablet 3  . sotalol (BETAPACE) 80 MG tablet Take 1 tablet (80 mg total) by mouth 2 (two) times daily. 180 tablet 3  . triamterene-hydrochlorothiazide (MAXZIDE-25) 37.5-25 MG tablet TAKE 1 TABLET BY MOUTH ONCE DAILY 90 tablet 2  . UNABLE TO FIND Med Name: Ellwood Handler from [facility] for allergies     No facility-administered medications prior to visit.     PAST MEDICAL HISTORY: Past Medical History:  Diagnosis Date  . Atrial fibrillation ([place])   . Chest pain, unspecified   . Hypertension   . Intermediate coronary syndrome ([place])   . Mixed hyperlipidemia   . Nonspecific abnormal unspecified cardiovascular function study   . Prostate cancer ([place])     PAST SURGICAL HISTORY: Past Surgical History:  Procedure Laterality Date  . HERNIA REPAIR     x 2  . PERCUTANEOUS CORONARY STENT INTERVENTION (PCI-S) N/A 05/02/2012   Procedure: PERCUTANEOUS CORONARY STENT INTERVENTION (PCI-S);  Surgeon: Jettie Booze, MD;  Location: [facility] CATH LAB;  Service: Cardiovascular;  Laterality: N/A;  . PROSTATECTOMY      FAMILY HISTORY: Family History  Problem Relation Age of Onset  . Alzheimer's disease Mother   . Cancer Father   . Lung cancer Sister   . Heart attack Neg Hx   . Hypertension Neg Hx   .  Stroke Neg Hx     SOCIAL HISTORY: Social History   Socioeconomic History  . Marital status: Married    Spouse name: Diane  . Number of children: Not on file  . Years of education: Not on file  . Highest education level: Some college, no degree  Occupational History    Comment: part time  Social Needs  . Financial resource strain: Not on file  . Food insecurity:    Worry: Not on file    Inability: Not on file  . Transportation needs:    Medical: Not on file    Non-medical: Not on file  Tobacco Use  . Smoking status: Former Research scientist (life sciences)  . Smokeless tobacco: Never Used  Substance and Sexual  Activity  . Alcohol use: No    Alcohol/week: 0.0 standard drinks  . Drug use: No  . Sexual activity: Not on file  Lifestyle  . Physical activity:    Days per week: Not on file    Minutes per session: Not on file  . Stress: Not on file  Relationships  . Social connections:    Talks on phone: Not on file    Gets together: Not on file    Attends religious service: Not on file    Active member of club or organization: Not on file    Attends meetings of clubs or organizations: Not on file    Relationship status: Not on file  . Intimate partner violence:    Fear of current or ex partner: Not on file    Emotionally abused: Not on file    Physically abused: Not on file    Forced sexual activity: Not on file  Other Topics Concern  . Not on file  Social History Narrative   Lives with wife   Caffeine- coffee 1-2 cups daily     PHYSICAL EXAM  VIDEO EXAM  GENERAL EXAM/CONSTITUTIONAL:  Vitals: There were no vitals filed for this visit.  There is no height or weight on file to calculate BMI. Wt Readings from Last 3 Encounters:  09/04/17 144 lb (65.3 kg)  08/16/16 151 lb 3.2 oz (68.6 kg)  11/09/15 151 lb 9.6 oz (68.8 kg)     Patient is in no distress; well developed, nourished and groomed; neck is supple   NEUROLOGIC: MENTAL STATUS:  No flowsheet data found.  awake, alert, oriented to person, place and time  recent and remote memory intact  normal attention and concentration  language fluent, comprehension intact, naming intact  fund of knowledge appropriate  CRANIAL NERVE:   2nd, 3rd, 4th, 6th - visual fields full to confrontation, extraocular muscles intact, no nystagmus; SUBJECTIVE DIPLOPIA ON RIGHT OR LEFT GAZE; IMPROVES WITH HEAD TILT DOWN AND HEAD TILE LEFT  5th - facial sensation symmetric  7th - facial strength symmetric  8th - hearing intact  11th - shoulder shrug symmetric  12th - tongue protrusion midline  MOTOR:   NO TREMOR; NO DRIFT IN BUE   SENSORY:   normal and symmetric to light touch  COORDINATION:   fine finger movements normal      DIAGNOSTIC DATA (LABS, IMAGING, TESTING) - I reviewed patient records, labs, notes, testing and imaging myself where available.  Lab Results  Component Value Date   WBC 5.8 08/22/2016   HGB 14.2 08/22/2016   HCT 41.1 08/22/2016   MCV 91 08/22/2016   PLT 222 08/22/2016      Component Value Date/Time   NA 143 08/22/2016 1114   K  3.5 08/22/2016 1114   CL 102 08/22/2016 1114   CO2 26 08/22/2016 1114   GLUCOSE 118 (H) 08/22/2016 1114   GLUCOSE 95 08/14/2015 1519   BUN 23 08/22/2016 1114   CREATININE 1.16 08/22/2016 1114   CALCIUM 9.2 08/22/2016 1114   PROT 6.8 06/21/2014 1049   ALBUMIN 3.9 06/21/2014 1049   AST 24 06/21/2014 1049   ALT 27 06/21/2014 1049   ALKPHOS 86 06/21/2014 1049   BILITOT 0.4 06/21/2014 1049   GFRNONAA 60 08/22/2016 1114   GFRAA 70 08/22/2016 1114   Lab Results  Component Value Date   CHOL 170 06/21/2014   HDL 54.50 06/21/2014   LDLCALC 95 06/21/2014   TRIG 103.0 06/21/2014   CHOLHDL 3 06/21/2014   Lab Results  Component Value Date   HGBA1C 5.9 (H) 04/25/2012   No results found for: VITAMINB12 No results found for: TSH     ASSESSMENT AND PLAN  78 y.o. year old male here with new onset vertical double vision, improved by tilting head down into the left side.  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  We will proceed with further work-up.  Symptoms are spontaneously improving over time.  Reviewed vascular risk factors.  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 Double vision with both eyes open     Virtual Visit via Video Note  I connected with [name] on 07/31/18 at 10:30 AM EDT by a video enabled telemedicine application and verified that I am speaking with the correct person using two identifiers.  Location: Patient: home  Provider: office   I discussed the limitations of  evaluation and management by telemedicine and the availability of in person appointments. The patient expressed understanding and agreed to proceed.  I discussed the assessment and treatment plan with the patient. The patient was provided an opportunity to ask questions and all were answered. The patient agreed with the plan and demonstrated an understanding of the instructions.   The patient was advised to call back or seek an in-person evaluation if the symptoms worsen or if the condition fails to improve as anticipated.  I provided 30 minutes of non-face-to-face time during this encounter.    PLAN:  NEW ONSET DOUBLE VISION (? Right CN4 palsy) - check MRI brain and orbits - continue vascular risk factor mgmt (eliquis, betablocker, statin)  Orders Placed This Encounter  Procedures  . MR BRAIN W WO CONTRAST  . MR ORBITS W WO CONTRAST   Return pending test results, for pending if symptoms worsen or fail to improve.    Penni Bombard, MD 9/62/9528, 41:32 AM Certified in Neurology, Neurophysiology and Neuroimaging  [facility] [address], [place], [place] [address] [phone]

## 2018-08-13 ENCOUNTER — Other Ambulatory Visit: Payer: Self-pay | Admitting: Interventional Cardiology

## 2018-08-13 NOTE — Telephone Encounter (Signed)
Pt last saw Dr Irish Lack 09/04/17, last labs 06/25/18 Creat 1.16 at [place], age 78, weight 65.3kg, based on specified criteria pt is on appropriate dosage of Eliquis 5mg  BID.  Will refill rx.

## 2018-09-02 ENCOUNTER — Telehealth: Payer: Self-pay

## 2018-09-02 NOTE — Telephone Encounter (Signed)
Virtual Visit Pre-Appointment Phone Call  TELEPHONE CALL NOTE  Vincent Hampton has been deemed a candidate for a follow-up tele-health visit to limit community exposure during the Covid-19 pandemic. I spoke with the patient via phone to ensure availability of phone/video source, confirm preferred email & phone number, and discuss instructions and expectations.  I reminded Vincent Hampton to be prepared with any vital sign and/or heart rhythm information that could potentially be obtained via home monitoring, at the time of his visit. I reminded Vincent Hampton to expect a phone call prior to his visit.  Patient agrees to consent below.  Vincent Gustin, RN 09/02/2018 9:40 AM    FULL LENGTH CONSENT FOR TELE-HEALTH VISIT   I hereby voluntarily request, consent and authorize [facility] and its employed or contracted physicians, physician assistants, nurse practitioners or other licensed health care professionals (the Practitioner), to provide me with telemedicine health care services (the "Services") as deemed necessary by the treating Practitioner. I acknowledge and consent to receive the Services by the Practitioner via telemedicine. I understand that the telemedicine visit will involve communicating with the Practitioner through live audiovisual communication technology and the disclosure of certain medical information by electronic transmission. I acknowledge that I have been given the opportunity to request an in-person assessment or other available alternative prior to the telemedicine visit and am voluntarily participating in the telemedicine visit.  I understand that I have the right to withhold or withdraw my consent to the use of telemedicine in the course of my care at any time, without affecting my right to future care or treatment, and that the Practitioner or I may terminate the telemedicine visit at any time. I understand that I have the right to inspect all information  obtained and/or recorded in the course of the telemedicine visit and may receive copies of available information for a reasonable fee.  I understand that some of the potential risks of receiving the Services via telemedicine include:  Vincent Hampton Delay or interruption in medical evaluation due to technological equipment failure or disruption; . Information transmitted may not be sufficient (e.g. poor resolution of images) to allow for appropriate medical decision making by the Practitioner; and/or  . In rare instances, security protocols could fail, causing a breach of personal health information.  Furthermore, I acknowledge that it is my responsibility to provide information about my medical history, conditions and care that is complete and accurate to the best of my ability. I acknowledge that Practitioner's advice, recommendations, and/or decision may be based on factors not within their control, such as incomplete or inaccurate data provided by me or distortions of diagnostic images or specimens that may result from electronic transmissions. I understand that the practice of medicine is not an exact science and that Practitioner makes no warranties or guarantees regarding treatment outcomes. I acknowledge that I will receive a copy of this consent concurrently upon execution via email to the email address I last provided but may also request a printed copy by calling the office of Vincent Hampton.    I understand that my insurance will be billed for this visit.   I have read or had this consent read to me. . I understand the contents of this consent, which adequately explains the benefits and risks of the Services being provided via telemedicine.  . I have been provided ample opportunity to ask questions regarding this consent and the Services and have had my questions answered to my satisfaction. Vincent Hampton  I give my informed consent for the services to be provided through the use of telemedicine in my medical care   By participating in this telemedicine visit I agree to the above.

## 2018-09-03 ENCOUNTER — Other Ambulatory Visit: Payer: Medicare Other

## 2018-09-05 ENCOUNTER — Other Ambulatory Visit: Payer: Self-pay

## 2018-09-05 ENCOUNTER — Telehealth (INDEPENDENT_AMBULATORY_CARE_PROVIDER_SITE_OTHER): Payer: Medicare Other | Admitting: Interventional Cardiology

## 2018-09-05 ENCOUNTER — Ambulatory Visit: Payer: Medicare Other | Admitting: Diagnostic Neuroimaging

## 2018-09-05 ENCOUNTER — Encounter: Payer: Self-pay | Admitting: Interventional Cardiology

## 2018-09-05 DIAGNOSIS — R9430 Abnormal result of cardiovascular function study, unspecified: Secondary | ICD-10-CM | POA: Diagnosis not present

## 2018-09-05 DIAGNOSIS — I25118 Atherosclerotic heart disease of native coronary artery with other forms of angina pectoris: Secondary | ICD-10-CM | POA: Diagnosis not present

## 2018-09-05 DIAGNOSIS — I10 Essential (primary) hypertension: Secondary | ICD-10-CM

## 2018-09-05 DIAGNOSIS — I480 Paroxysmal atrial fibrillation: Secondary | ICD-10-CM | POA: Diagnosis not present

## 2018-09-05 DIAGNOSIS — E782 Mixed hyperlipidemia: Secondary | ICD-10-CM

## 2018-09-05 NOTE — Progress Notes (Signed)
Virtual Visit via Video Note   This visit type was conducted due to national recommendations for restrictions regarding the COVID-19 Pandemic (e.g. social distancing) in an effort to limit this patient's exposure and mitigate transmission in our community.  Due to his co-morbid illnesses, this patient is at least at moderate risk for complications without adequate follow up.  This format is felt to be most appropriate for this patient at this time.  All issues noted in this document were discussed and addressed.  A limited physical exam was performed with this format.  Please refer to the patient's chart for his consent to telehealth for [facility].   Date:  09/05/2018   ID:  Vincent Hampton, DOB 1940-11-01, MRN [id number]  Patient Location: Home Provider Location: Home  PCP:  Hulan Fess, MD  Cardiologist:  Larae Grooms, MD  Electrophysiologist:  None   Evaluation Performed:  Follow-Up Visit  Chief Complaint:  CAD/AFib  History of Present Illness:    Vincent Hampton is a 78 y.o. male who has had PAF. He had a prox LAD stent in Jan 2014 for unstable angina sx. His anginal equivalent was left arm pain. This has resolved post stent.  No AFib sx.  He recently had double vision and there was concern for microinfarct.  Scan was ordered but he canceled this since the double vision went away.  He has been working outside.  He has been doing well.   Denies : Chest pain. Dizziness. Leg edema. Nitroglycerin use. Orthopnea. Palpitations. Paroxysmal nocturnal dyspnea. Shortness of breath. Syncope.   The patient does not have symptoms concerning for COVID-19 infection (fever, chills, cough, or new shortness of breath).    Past Medical History:  Diagnosis Date  . Atrial fibrillation ([place])   . Chest pain, unspecified   . Hypertension   . Intermediate coronary syndrome ([place])   . Mixed hyperlipidemia   . Nonspecific abnormal unspecified cardiovascular function study   .  Prostate cancer [facility])    Past Surgical History:  Procedure Laterality Date  . HERNIA REPAIR     x 2  . PERCUTANEOUS CORONARY STENT INTERVENTION (PCI-S) N/A 05/02/2012   Procedure: PERCUTANEOUS CORONARY STENT INTERVENTION (PCI-S);  Surgeon: Jettie Booze, MD;  Location: [facility] CATH LAB;  Service: Cardiovascular;  Laterality: N/A;  . PROSTATECTOMY       Current Meds  Medication Sig  . allopurinol (ZYLOPRIM) 100 MG tablet Take 100 mg by mouth daily as needed (GOUT).   . cholecalciferol (VITAMIN D) 1000 units tablet Take 1,000 Units by mouth daily.  [name] ELIQUIS 5 MG TABS tablet Take 1 tablet by mouth twice daily  . metoprolol tartrate (LOPRESSOR) 25 MG tablet TAKE 1/2 (ONE-HALF) TABLET BY MOUTH TWICE DAILY  . nitroGLYCERIN (NITROSTAT) 0.4 MG SL tablet Place 1 tablet (0.4 mg total) under the tongue every 5 (five) minutes as needed for chest pain (UP TO 3 DOSES BEFORE CALLING EMS).  [name] PROAIR HFA 108 (90 BASE) MCG/ACT inhaler Inhale 2 puffs into the lungs every 6 (six) hours as needed for wheezing or shortness of breath.   . simvastatin (ZOCOR) 40 MG tablet TAKE 1 TABLET BY MOUTH ONCE DAILY AT  6PM  . sotalol (BETAPACE) 80 MG tablet Take 1 tablet (80 mg total) by mouth 2 (two) times daily.  [name] triamterene-hydrochlorothiazide (MAXZIDE-25) 37.5-25 MG tablet TAKE 1 TABLET BY MOUTH ONCE DAILY  . UNABLE TO FIND Med Name: Ellwood Handler from [facility] for allergies     Allergies:  Patient has no known allergies.   Social History   Tobacco Use  . Smoking status: Former Research scientist (life sciences)  . Smokeless tobacco: Never Used  Substance Use Topics  . Alcohol use: No    Alcohol/week: 0.0 standard drinks  . Drug use: No     Family Hx: The patient's family history includes Alzheimer's disease in his mother; Cancer in his father; Lung cancer in his sister. There is no history of Heart attack, Hypertension, or Stroke.  ROS:   Please see the history of present illness.    Back pain All other systems reviewed and are  negative.   Prior CV studies:   The following studies were reviewed today:  Cath results above  Labs/Other Tests and Data Reviewed:    EKG:  An ECG dated 6/19 was personally reviewed today and demonstrated:  NSR, RBBB, LAFB  Recent Labs: No results found for requested labs within last 8760 hours.   Recent Lipid Panel Lab Results  Component Value Date/Time   CHOL 170 06/21/2014 10:49 AM   TRIG 103.0 06/21/2014 10:49 AM   HDL 54.50 06/21/2014 10:49 AM   CHOLHDL 3 06/21/2014 10:49 AM   LDLCALC 95 06/21/2014 10:49 AM    Wt Readings from Last 3 Encounters:  09/05/18 138 lb (62.6 kg)  09/04/17 144 lb (65.3 kg)  08/16/16 151 lb 3.2 oz (68.6 kg)     Objective:    Vital Signs:  BP 129/83   Pulse 94   Ht 6\' 2"  (1.88 m)   Wt 138 lb (62.6 kg)   BMI 17.72 kg/m    VITAL SIGNS:  reviewed GEN:  no acute distress RESPIRATORY:  normal respiratory effort, symmetric expansion PSYCH:  normal affect exam limited by video format  ASSESSMENT & PLAN:    1. CAD: No angina on medical therapy.  COntinue aggressive secondary prevention.   2. AFib: Eliquis for stroke prevention.  Rate controlled.  Still on sotalol but may have had some AFib recently.   HR in sinus was in the mid 50s.  Check pulse at home to see if he remains in AFib or converts to NSR.  3. Visual disturbance has resolved.  4. Hyperlipidemia: The current medical regimen is effective;  continue present plan and medications. 5. HTN: The current medical regimen is effective;  continue present plan and medications.   COVID-19 Education: The signs and symptoms of COVID-19 were discussed with the patient and how to seek care for testing (follow up with PCP or arrange E-visit).  The importance of social distancing was discussed today.  Time:   Today, I have spent 25 minutes with the patient with telehealth technology discussing the above problems.     Medication Adjustments/Labs and Tests Ordered: Current medicines are  reviewed at length with the patient today.  Concerns regarding medicines are outlined above.   Tests Ordered: No orders of the defined types were placed in this encounter.   Medication Changes: No orders of the defined types were placed in this encounter.   Disposition:  Follow up in 6 month(s)  Signed, Larae Grooms, MD  09/05/2018 2:27 PM    [place]

## 2018-09-05 NOTE — Patient Instructions (Signed)
Medication Instructions:  Your physician recommends that you continue on your current medications as directed. Please refer to the Current Medication list given to you today.  If you need a refill on your cardiac medications before your next appointment, please call your pharmacy.   Lab work: None Ordered  If you have labs (blood work) drawn today and your tests are completely normal, you will receive your results only by: [name] MyChart Message (if you have MyChart) OR . A paper copy in the mail If you have any lab test that is abnormal or we need to change your treatment, we will call you to review the results.  Testing/Procedures: None ordered  Follow-Up: At [facility], you and your health needs are our priority.  As part of our continuing mission to provide you with exceptional heart care, we have created designated Provider Care Teams.  These Care Teams include your primary Cardiologist (physician) and Advanced Practice Providers (APPs -  Physician Assistants and Nurse Practitioners) who all work together to provide you with the care you need, when you need it. . You will need a follow up appointment in 6 months.  Please call our office 2 months in advance to schedule this appointment.  You may see Casandra Doffing, MD or one of the following Advanced Practice Providers on your designated Care Team:   . Lyda Jester, PA-C . Dayna Dunn, PA-C . Ermalinda Barrios, PA-C  Any Other Special Instructions Will Be Listed Below (If Applicable).  Monitor your Blood Pressure and Heart Rate at home. Let us know if you are still having irregular beats in a few weeks.

## 2018-09-11 ENCOUNTER — Other Ambulatory Visit: Payer: Self-pay | Admitting: Interventional Cardiology

## 2018-10-08 DIAGNOSIS — I10 Essential (primary) hypertension: Secondary | ICD-10-CM | POA: Diagnosis not present

## 2018-11-25 ENCOUNTER — Other Ambulatory Visit: Payer: Self-pay | Admitting: Interventional Cardiology

## 2019-01-27 DIAGNOSIS — Z23 Encounter for immunization: Secondary | ICD-10-CM | POA: Diagnosis not present

## 2019-02-11 ENCOUNTER — Other Ambulatory Visit: Payer: Self-pay | Admitting: Interventional Cardiology

## 2019-02-11 NOTE — Telephone Encounter (Signed)
Eliquis 5mg  refill request received, pt is 78 yrs old, weight-62.6kg, Crea-0.93 on 10/08/2018 via [facility] from [place] PCP, Diagnosis-Afib, and last seen by Dr. Irish Lack on 09/05/2018 via Telemedicine. Dose is appropriate based on dosing criteria. Will send in refill to requested pharmacy.

## 2019-02-17 ENCOUNTER — Other Ambulatory Visit: Payer: Self-pay | Admitting: Interventional Cardiology

## 2019-03-04 DIAGNOSIS — D225 Melanocytic nevi of trunk: Secondary | ICD-10-CM | POA: Diagnosis not present

## 2019-03-04 DIAGNOSIS — L814 Other melanin hyperpigmentation: Secondary | ICD-10-CM | POA: Diagnosis not present

## 2019-03-04 DIAGNOSIS — L309 Dermatitis, unspecified: Secondary | ICD-10-CM | POA: Diagnosis not present

## 2019-03-04 DIAGNOSIS — Z23 Encounter for immunization: Secondary | ICD-10-CM | POA: Diagnosis not present

## 2019-03-04 DIAGNOSIS — Z85828 Personal history of other malignant neoplasm of skin: Secondary | ICD-10-CM | POA: Diagnosis not present

## 2019-03-04 DIAGNOSIS — L821 Other seborrheic keratosis: Secondary | ICD-10-CM | POA: Diagnosis not present

## 2019-04-09 DIAGNOSIS — Z23 Encounter for immunization: Secondary | ICD-10-CM | POA: Diagnosis not present

## 2019-04-09 DIAGNOSIS — L03011 Cellulitis of right finger: Secondary | ICD-10-CM | POA: Diagnosis not present

## 2019-04-23 NOTE — Progress Notes (Signed)
Cardiology Office Note   Date:  04/24/2019   ID:  Vincent Hampton, Vincent Hampton May 14, 1940, MRN [id number]  PCP:  Hulan Fess, MD    No chief complaint on file.  CAD  Wt Readings from Last 3 Encounters:  04/24/19 126 lb (57.2 kg)  09/05/18 138 lb (62.6 kg)  09/04/17 144 lb (65.3 kg)       History of Present Illness: Vincent Hampton is a 79 y.o. male  who has had PAF. He had a prox LAD stent in Jan 2014 for unstable angina sx. His anginal equivalent was left arm pain. This has resolved post stent.  No AFib sx.  In 2020, he had double vision and there was concern for microinfarct.  Scan was ordered but he canceled this since the double vision went away.  He thinks it has returned in a mild way.   He has been working around the house.    Denies : Chest pain. Dizziness. Leg edema. Nitroglycerin use. Orthopnea. Palpitations. Paroxysmal nocturnal dyspnea. Shortness of breath. Syncope.         Past Medical History:  Diagnosis Date  . Atrial fibrillation ([place])   . Chest pain, unspecified   . Hypertension   . Intermediate coronary syndrome ([place])   . Mixed hyperlipidemia   . Nonspecific abnormal unspecified cardiovascular function study   . Prostate cancer [facility])     Past Surgical History:  Procedure Laterality Date  . HERNIA REPAIR     x 2  . PERCUTANEOUS CORONARY STENT INTERVENTION (PCI-S) N/A 05/02/2012   Procedure: PERCUTANEOUS CORONARY STENT INTERVENTION (PCI-S);  Surgeon: Jettie Booze, MD;  Location: [facility] CATH LAB;  Service: Cardiovascular;  Laterality: N/A;  . PROSTATECTOMY       Current Outpatient Medications  Medication Sig Dispense Refill  . allopurinol (ZYLOPRIM) 100 MG tablet Take 100 mg by mouth daily as needed (GOUT).     . cholecalciferol (VITAMIN D) 1000 units tablet Take 1,000 Units by mouth daily.    [name] ELIQUIS 5 MG TABS tablet Take 1 tablet by mouth twice daily 180 tablet 2  . metoprolol tartrate (LOPRESSOR) 25 MG tablet Take 1/2 (one-half)  tablet by mouth twice daily 90 tablet 1  . nitroGLYCERIN (NITROSTAT) 0.4 MG SL tablet PLACE 1 TABLET UNDER THE TONGUE EVERY 5 MINUTES AS NEEDED FOR CHEST PAIN (UP TO 3 DOSES BEFORE CALLING EMS) 25 tablet 4  . PROAIR HFA 108 (90 BASE) MCG/ACT inhaler Inhale 2 puffs into the lungs every 6 (six) hours as needed for wheezing or shortness of breath.     . simvastatin (ZOCOR) 40 MG tablet TAKE 1 TABLET BY MOUTH ONCE DAILY AT  6PM 90 tablet 1  . sotalol (BETAPACE) 80 MG tablet Take 1 tablet by mouth twice daily 180 tablet 3  . triamterene-hydrochlorothiazide (MAXZIDE-25) 37.5-25 MG tablet TAKE 1 TABLET BY MOUTH ONCE DAILY 90 tablet 2  . UNABLE TO FIND Med Name: Ellwood Handler from [facility] for allergies     No current facility-administered medications for this visit.    Allergies:   Patient has no known allergies.    Social History:  The patient  reports that he has quit smoking. He has never used smokeless tobacco. He reports that he does not drink alcohol or use drugs.   Family History:  The patient's family history includes Alzheimer's disease in his mother; Cancer in his father; Lung cancer in his sister.    ROS:  Please see the history of present illness.  Otherwise, review of systems are positive for weight loss.   All other systems are reviewed and negative.    PHYSICAL EXAM: VS:  BP 133/84   Pulse (!) 57   Ht 6\' 2"  (1.88 m)   Wt 126 lb (57.2 kg)   SpO2 96%   BMI 16.18 kg/m  , BMI Body mass index is 16.18 kg/m. GEN: Well nourished, well developed, in no acute distress  HEENT: normal  Neck: no JVD, carotid bruits, or masses Cardiac: bradycardia; no murmurs, rubs, or gallops,no edema  Respiratory:  clear to auscultation bilaterally, normal work of breathing GI: soft, nontender, nondistended, + BS MS: no deformity or atrophy  Skin: warm and dry, no rash Neuro:  Strength and sensation are intact Psych: euthymic mood, full affect   EKG:   The ekg ordered today demonstrates sinus  bradycardia, RBBB, no change from prior   Recent Labs: No results found for requested labs within last 8760 hours.   Lipid Panel    Component Value Date/Time   CHOL 170 06/21/2014 1049   TRIG 103.0 06/21/2014 1049   HDL 54.50 06/21/2014 1049   CHOLHDL 3 06/21/2014 1049   VLDL 20.6 06/21/2014 1049   LDLCALC 95 06/21/2014 1049     Other studies Reviewed: Additional studies/ records that were reviewed today with results demonstrating: labs review.  Labs reviewed   ASSESSMENT AND PLAN:  1. CAD: No angina.  COntinue aggressive secondary prevention.  2. AFib: in NSR.  Stable on sotalol.  Renal function normal in 10/2018.  3. Hyperlipidemia: The current medical regimen is effective;  continue present plan and medications. 4. HTN: The current medical regimen is effective;  continue present plan and medications.  Home readings are controlled.  Can be low if he does not drink enough water.   Current medicines are reviewed at length with the patient today.  The patient concerns regarding his medicines were addressed.  The following changes have been made:  No change  Labs/ tests ordered today include:  No orders of the defined types were placed in this encounter.   Recommend 150 minutes/week of aerobic exercise Low fat, low carb, high fiber diet recommended  Disposition:   FU in 1 year   Signed, Larae Grooms, MD  04/24/2019 3:14 PM    [facility] [place], [place], [place]  [address] Phone: [phone]; Fax: [phone]

## 2019-04-24 ENCOUNTER — Ambulatory Visit (INDEPENDENT_AMBULATORY_CARE_PROVIDER_SITE_OTHER): Payer: Medicare Other | Admitting: Interventional Cardiology

## 2019-04-24 ENCOUNTER — Encounter: Payer: Self-pay | Admitting: Interventional Cardiology

## 2019-04-24 ENCOUNTER — Other Ambulatory Visit: Payer: Self-pay

## 2019-04-24 VITALS — BP 133/84 | HR 57 | Ht 74.0 in | Wt 126.0 lb

## 2019-04-24 DIAGNOSIS — I10 Essential (primary) hypertension: Secondary | ICD-10-CM

## 2019-04-24 DIAGNOSIS — I480 Paroxysmal atrial fibrillation: Secondary | ICD-10-CM

## 2019-04-24 DIAGNOSIS — E782 Mixed hyperlipidemia: Secondary | ICD-10-CM | POA: Diagnosis not present

## 2019-04-24 DIAGNOSIS — I25118 Atherosclerotic heart disease of native coronary artery with other forms of angina pectoris: Secondary | ICD-10-CM | POA: Diagnosis not present

## 2019-04-24 NOTE — Patient Instructions (Signed)

## 2019-04-28 ENCOUNTER — Ambulatory Visit: Payer: Medicare Other

## 2019-05-07 ENCOUNTER — Ambulatory Visit: Payer: Medicare Other

## 2019-05-27 ENCOUNTER — Other Ambulatory Visit: Payer: Self-pay | Admitting: Interventional Cardiology

## 2019-07-01 DIAGNOSIS — Z86010 Personal history of colonic polyps: Secondary | ICD-10-CM | POA: Diagnosis not present

## 2019-07-01 DIAGNOSIS — Z0000 Encounter for general adult medical examination without abnormal findings: Secondary | ICD-10-CM | POA: Diagnosis not present

## 2019-07-01 DIAGNOSIS — M545 Low back pain: Secondary | ICD-10-CM | POA: Diagnosis not present

## 2019-07-01 DIAGNOSIS — Z8739 Personal history of other diseases of the musculoskeletal system and connective tissue: Secondary | ICD-10-CM | POA: Diagnosis not present

## 2019-07-01 DIAGNOSIS — R7301 Impaired fasting glucose: Secondary | ICD-10-CM | POA: Diagnosis not present

## 2019-07-01 DIAGNOSIS — R634 Abnormal weight loss: Secondary | ICD-10-CM | POA: Diagnosis not present

## 2019-07-01 DIAGNOSIS — I10 Essential (primary) hypertension: Secondary | ICD-10-CM | POA: Diagnosis not present

## 2019-07-01 DIAGNOSIS — I480 Paroxysmal atrial fibrillation: Secondary | ICD-10-CM | POA: Diagnosis not present

## 2019-07-01 DIAGNOSIS — I2510 Atherosclerotic heart disease of native coronary artery without angina pectoris: Secondary | ICD-10-CM | POA: Diagnosis not present

## 2019-07-01 DIAGNOSIS — Z681 Body mass index (BMI) 19 or less, adult: Secondary | ICD-10-CM | POA: Diagnosis not present

## 2019-07-06 DIAGNOSIS — Z86010 Personal history of colonic polyps: Secondary | ICD-10-CM | POA: Diagnosis not present

## 2019-07-06 DIAGNOSIS — Z8739 Personal history of other diseases of the musculoskeletal system and connective tissue: Secondary | ICD-10-CM | POA: Diagnosis not present

## 2019-07-06 DIAGNOSIS — I2510 Atherosclerotic heart disease of native coronary artery without angina pectoris: Secondary | ICD-10-CM | POA: Diagnosis not present

## 2019-07-06 DIAGNOSIS — Z681 Body mass index (BMI) 19 or less, adult: Secondary | ICD-10-CM | POA: Diagnosis not present

## 2019-07-06 DIAGNOSIS — R634 Abnormal weight loss: Secondary | ICD-10-CM | POA: Diagnosis not present

## 2019-07-06 DIAGNOSIS — R7301 Impaired fasting glucose: Secondary | ICD-10-CM | POA: Diagnosis not present

## 2019-07-06 DIAGNOSIS — Z0000 Encounter for general adult medical examination without abnormal findings: Secondary | ICD-10-CM | POA: Diagnosis not present

## 2019-07-06 DIAGNOSIS — I480 Paroxysmal atrial fibrillation: Secondary | ICD-10-CM | POA: Diagnosis not present

## 2019-07-06 DIAGNOSIS — M545 Low back pain: Secondary | ICD-10-CM | POA: Diagnosis not present

## 2019-07-06 DIAGNOSIS — I10 Essential (primary) hypertension: Secondary | ICD-10-CM | POA: Diagnosis not present

## 2019-09-11 ENCOUNTER — Other Ambulatory Visit: Payer: Self-pay | Admitting: Interventional Cardiology

## 2019-11-08 ENCOUNTER — Other Ambulatory Visit: Payer: Self-pay | Admitting: Interventional Cardiology

## 2019-11-09 NOTE — Telephone Encounter (Signed)
Age 79, weight 57kg, SCr 1.04 on 07/06/19 at [facility].  Weight last year was consistently > 60kg, has only dropped below threshold on January 2021 visit. Called pt to assess home weight, wife reports pt has been weighing 128-129 lbs over the past year since he has been sleeping later and eating less. Will decrease Eliquis dose to 2.5mg  BID and advised her to keep an eye on pt's weight at home as 132 lbs is our threshold for Eliquis dosing for him. She verbalized understanding.

## 2020-02-19 DIAGNOSIS — Z23 Encounter for immunization: Secondary | ICD-10-CM | POA: Diagnosis not present

## 2020-03-02 DIAGNOSIS — L821 Other seborrheic keratosis: Secondary | ICD-10-CM | POA: Diagnosis not present

## 2020-03-02 DIAGNOSIS — L820 Inflamed seborrheic keratosis: Secondary | ICD-10-CM | POA: Diagnosis not present

## 2020-03-02 DIAGNOSIS — L814 Other melanin hyperpigmentation: Secondary | ICD-10-CM | POA: Diagnosis not present

## 2020-03-02 DIAGNOSIS — D225 Melanocytic nevi of trunk: Secondary | ICD-10-CM | POA: Diagnosis not present

## 2020-03-02 DIAGNOSIS — T148XXA Other injury of unspecified body region, initial encounter: Secondary | ICD-10-CM | POA: Diagnosis not present

## 2020-03-02 DIAGNOSIS — L905 Scar conditions and fibrosis of skin: Secondary | ICD-10-CM | POA: Diagnosis not present

## 2020-03-02 DIAGNOSIS — L578 Other skin changes due to chronic exposure to nonionizing radiation: Secondary | ICD-10-CM | POA: Diagnosis not present

## 2020-03-02 DIAGNOSIS — Z85828 Personal history of other malignant neoplasm of skin: Secondary | ICD-10-CM | POA: Diagnosis not present

## 2020-03-08 ENCOUNTER — Other Ambulatory Visit: Payer: Self-pay | Admitting: Interventional Cardiology

## 2020-03-15 DIAGNOSIS — H26493 Other secondary cataract, bilateral: Secondary | ICD-10-CM | POA: Diagnosis not present

## 2020-03-15 DIAGNOSIS — Z961 Presence of intraocular lens: Secondary | ICD-10-CM | POA: Diagnosis not present

## 2020-03-15 DIAGNOSIS — H524 Presbyopia: Secondary | ICD-10-CM | POA: Diagnosis not present

## 2020-03-15 DIAGNOSIS — H532 Diplopia: Secondary | ICD-10-CM | POA: Diagnosis not present

## 2020-03-17 DIAGNOSIS — J0100 Acute maxillary sinusitis, unspecified: Secondary | ICD-10-CM | POA: Diagnosis not present

## 2020-04-18 ENCOUNTER — Ambulatory Visit: Payer: Medicare Other

## 2020-04-24 NOTE — Progress Notes (Signed)
Cardiology Office Note   Date:  04/25/2020   ID:  Eliezer, Vincent 02/15/1941, MRN [id number]  PCP:  Hulan Fess, MD    No chief complaint on file.  CAD  Wt Readings from Last 3 Encounters:  04/25/20 130 lb 6.4 oz (59.1 kg)  04/24/19 126 lb (57.2 kg)  09/05/18 138 lb (62.6 kg)       History of Present Illness: Vincent Hampton is a 80 y.o. male  who has had PAF. He had a prox LAD stent in Jan 2014 for unstable angina sx. His anginal equivalent was left arm pain. This has resolved post stent.  No AFib sx.  In 2020, he had double vision and there was concern for microinfarct. Scan was ordered but he canceled this since the double vision went away.  He thinks it has returned in a mild way.   Since he last visit, he has had only one noticeable episode of palpitations.    Denies : Chest pain. Dizziness. Leg edema. Nitroglycerin use. Orthopnea.  Paroxysmal nocturnal dyspnea. Shortness of breath. Syncope.   No bleeding problems.   Tolerated two [place] shots.  Will get booster soon.    He is trying to gain weight.    Past Medical History:  Diagnosis Date  . Atrial fibrillation ([place])   . Chest pain, unspecified   . Hypertension   . Intermediate coronary syndrome ([place])   . Mixed hyperlipidemia   . Nonspecific abnormal unspecified cardiovascular function study   . Prostate cancer [facility])     Past Surgical History:  Procedure Laterality Date  . HERNIA REPAIR     x 2  . PERCUTANEOUS CORONARY STENT INTERVENTION (PCI-S) N/A 05/02/2012   Procedure: PERCUTANEOUS CORONARY STENT INTERVENTION (PCI-S);  Surgeon: Jettie Booze, MD;  Location: [facility] CATH LAB;  Service: Cardiovascular;  Laterality: N/A;  . PROSTATECTOMY       Current Outpatient Medications  Medication Sig Dispense Refill  . allopurinol (ZYLOPRIM) 100 MG tablet Take 100 mg by mouth daily as needed (GOUT).     [name] apixaban (ELIQUIS) 2.5 MG TABS tablet Take 1 tablet (2.5 mg total) by mouth 2  (two) times daily. 60 tablet 5  . cholecalciferol (VITAMIN D) 1000 units tablet Take 1,000 Units by mouth daily.    . metoprolol tartrate (LOPRESSOR) 25 MG tablet Take 1/2 (one-half) tablet by mouth twice daily 90 tablet 3  . nitroGLYCERIN (NITROSTAT) 0.4 MG SL tablet PLACE 1 TABLET UNDER THE TONGUE EVERY 5 MINUTES AS NEEDED FOR CHEST PAIN (UP TO 3 DOSES BEFORE CALLING EMS) 25 tablet 4  . PROAIR HFA 108 (90 BASE) MCG/ACT inhaler Inhale 2 puffs into the lungs every 6 (six) hours as needed for wheezing or shortness of breath.     . simvastatin (ZOCOR) 40 MG tablet TAKE 1 TABLET BY MOUTH ONCE DAILY AT  6PM 90 tablet 3  . sotalol (BETAPACE) 80 MG tablet Take 1 tablet by mouth twice daily 180 tablet 0  . triamterene-hydrochlorothiazide (MAXZIDE-25) 37.5-25 MG tablet TAKE 1 TABLET BY MOUTH ONCE DAILY 90 tablet 2  . UNABLE TO FIND Med Name: Ellwood Handler from [facility] for allergies     No current facility-administered medications for this visit.    Allergies:   Patient has no known allergies.    Social History:  The patient  reports that he has quit smoking. He has never used smokeless tobacco. He reports that he does not drink alcohol and does not use drugs.  Family History:  The patient's family history includes Alzheimer's disease in his mother; Cancer in his father; Lung cancer in his sister.    ROS:  Please see the history of present illness.   Otherwise, review of systems are positive for one episode of palpitations; recent sinus infection treated with antiobiotic.   All other systems are reviewed and negative.    PHYSICAL EXAM: VS:  BP 124/72   Pulse (!) 58   Ht 6\' 2"  (1.88 m)   Wt 130 lb 6.4 oz (59.1 kg)   SpO2 96%   BMI 16.74 kg/m  , BMI Body mass index is 16.74 kg/m. GEN: Well nourished, well developed, in no acute distress  HEENT: normal  Neck: no JVD, carotid bruits, or masses Cardiac: RRR; no murmurs, rubs, or gallops,no edema  Respiratory:  clear to auscultation bilaterally,  normal work of breathing GI: soft, nontender, nondistended, + BS MS: no deformity or atrophy  Skin: warm and dry, no rash Neuro:  Strength and sensation are intact Psych: euthymic mood, full affect   EKG:   The ekg ordered today demonstrates sinus bradycardia, RBBB, no ST changes   Recent Labs: No results found for requested labs within last 8760 hours.   Lipid Panel    Component Value Date/Time   CHOL 170 06/21/2014 1049   TRIG 103.0 06/21/2014 1049   HDL 54.50 06/21/2014 1049   CHOLHDL 3 06/21/2014 1049   VLDL 20.6 06/21/2014 1049   LDLCALC 95 06/21/2014 1049     Other studies Reviewed: Additional studies/ records that were reviewed today with results demonstrating: labs with PMD reviewed .   ASSESSMENT AND PLAN:  1. CAD: Continue aggressive secondary prevention.  Continue heart healthy diet.  Increase fiber intake.  Increase plant based protein intake.  2. AFib: NSR.  Eliquis for stroke prevention.  3. HTN: Low-salt diet.  Avoid processed foods. 4. Hyperlipidemia: Whole food, plant-based diet.  High-fiber intake recommended. 5. Dr. Rex Kras to check CBC and BMet for f/u of Eliquis, in 4/22.GOing forward, he should have CBC and BMet every 6 months.   Current medicines are reviewed at length with the patient today.  The patient concerns regarding his medicines were addressed.  The following changes have been made:  No change  Labs/ tests ordered today include:  No orders of the defined types were placed in this encounter.   Recommend 150 minutes/week of aerobic exercise Low fat, low carb, high fiber diet recommended  Disposition:   FU in 1 year   Signed, Larae Grooms, MD  04/25/2020 3:40 PM    [facility] [place], [place], [place]  [address] Phone: [phone]; Fax: [phone]

## 2020-04-25 ENCOUNTER — Other Ambulatory Visit: Payer: Self-pay

## 2020-04-25 ENCOUNTER — Encounter: Payer: Self-pay | Admitting: Interventional Cardiology

## 2020-04-25 ENCOUNTER — Ambulatory Visit (INDEPENDENT_AMBULATORY_CARE_PROVIDER_SITE_OTHER): Payer: Medicare Other | Admitting: Interventional Cardiology

## 2020-04-25 VITALS — BP 124/72 | HR 58 | Ht 74.0 in | Wt 130.4 lb

## 2020-04-25 DIAGNOSIS — I480 Paroxysmal atrial fibrillation: Secondary | ICD-10-CM

## 2020-04-25 DIAGNOSIS — I25118 Atherosclerotic heart disease of native coronary artery with other forms of angina pectoris: Secondary | ICD-10-CM

## 2020-04-25 DIAGNOSIS — I10 Essential (primary) hypertension: Secondary | ICD-10-CM

## 2020-04-25 DIAGNOSIS — E782 Mixed hyperlipidemia: Secondary | ICD-10-CM | POA: Diagnosis not present

## 2020-04-25 NOTE — Patient Instructions (Signed)
Medication Instructions:  Your physician recommends that you continue on your current medications as directed. Please refer to the Current Medication list given to you today.  *If you need a refill on your cardiac medications before your next appointment, please call your pharmacy*   Lab Work: NONE If you have labs (blood work) drawn today and your tests are completely normal, you will receive your results only by: MyChart Message (if you have MyChart) OR A paper copy in the mail If you have any lab test that is abnormal or we need to change your treatment, we will call you to review the results.   Testing/Procedures: NONE   Follow-Up: At [facility], you and your health needs are our priority.  As part of our continuing mission to provide you with exceptional heart care, we have created designated Provider Care Teams.  These Care Teams include your primary Cardiologist (physician) and Advanced Practice Providers (APPs -  Physician Assistants and Nurse Practitioners) who all work together to provide you with the care you need, when you need it.  We recommend signing up for the patient portal called "MyChart".  Sign up information is provided on this After Visit Summary.  MyChart is used to connect with patients for Virtual Visits (Telemedicine).  Patients are able to view lab/test results, encounter notes, upcoming appointments, etc.  Non-urgent messages can be sent to your provider as well.   To learn more about what you can do with MyChart, go to [email or web address]://[email or web address].com.    Your next appointment:   1 year(s)  The format for your next appointment:   In Person  Provider:   You may see Jayadeep Varanasi, MD or one of the following Advanced Practice Providers on your designated Care Team:   Dayna Dunn, PA-C Michele Lenze, PA-C    

## 2020-04-27 DIAGNOSIS — Z23 Encounter for immunization: Secondary | ICD-10-CM | POA: Diagnosis not present

## 2020-05-09 ENCOUNTER — Other Ambulatory Visit: Payer: Self-pay | Admitting: Interventional Cardiology

## 2020-05-25 ENCOUNTER — Other Ambulatory Visit: Payer: Self-pay | Admitting: Interventional Cardiology

## 2020-05-30 ENCOUNTER — Other Ambulatory Visit: Payer: Self-pay | Admitting: Interventional Cardiology

## 2020-06-14 ENCOUNTER — Other Ambulatory Visit: Payer: Self-pay | Admitting: Interventional Cardiology

## 2020-08-18 DIAGNOSIS — W57XXXA Bitten or stung by nonvenomous insect and other nonvenomous arthropods, initial encounter: Secondary | ICD-10-CM | POA: Diagnosis not present

## 2020-08-18 DIAGNOSIS — S90562S Insect bite (nonvenomous), left ankle, sequela: Secondary | ICD-10-CM | POA: Diagnosis not present

## 2020-08-19 DIAGNOSIS — Z20822 Contact with and (suspected) exposure to covid-19: Secondary | ICD-10-CM | POA: Diagnosis not present

## 2020-11-01 ENCOUNTER — Other Ambulatory Visit: Payer: Self-pay | Admitting: Interventional Cardiology

## 2020-11-01 DIAGNOSIS — Z8739 Personal history of other diseases of the musculoskeletal system and connective tissue: Secondary | ICD-10-CM | POA: Diagnosis not present

## 2020-11-01 DIAGNOSIS — I480 Paroxysmal atrial fibrillation: Secondary | ICD-10-CM

## 2020-11-01 DIAGNOSIS — Z681 Body mass index (BMI) 19 or less, adult: Secondary | ICD-10-CM | POA: Diagnosis not present

## 2020-11-01 DIAGNOSIS — M5459 Other low back pain: Secondary | ICD-10-CM | POA: Diagnosis not present

## 2020-11-01 DIAGNOSIS — R7301 Impaired fasting glucose: Secondary | ICD-10-CM | POA: Diagnosis not present

## 2020-11-01 DIAGNOSIS — I2510 Atherosclerotic heart disease of native coronary artery without angina pectoris: Secondary | ICD-10-CM | POA: Diagnosis not present

## 2020-11-01 DIAGNOSIS — I10 Essential (primary) hypertension: Secondary | ICD-10-CM | POA: Diagnosis not present

## 2020-11-01 NOTE — Telephone Encounter (Signed)
Prescription refill request for Eliquis received. Indication: afib  Last office visit: varanasi, 04/25/2020 Scr: 1.04, 07/06/2019 Age: 80 yo  Weight: 59.1 kg   Pt is overdue for labs.

## 2020-11-01 NOTE — Telephone Encounter (Signed)
Called pt and made pt a lab appointment for tomorrow. Pt stated he has a 4 to 5 day supply of Eliquis.

## 2020-11-02 ENCOUNTER — Other Ambulatory Visit: Payer: Self-pay

## 2020-11-02 ENCOUNTER — Other Ambulatory Visit: Payer: Medicare Other | Admitting: *Deleted

## 2020-11-02 DIAGNOSIS — I480 Paroxysmal atrial fibrillation: Secondary | ICD-10-CM | POA: Diagnosis not present

## 2020-11-02 LAB — CBC
Hematocrit: 40.5 % (ref 37.5–51.0)
Hemoglobin: 13.9 g/dL (ref 13.0–17.7)
MCH: 30.6 pg (ref 26.6–33.0)
MCHC: 34.3 g/dL (ref 31.5–35.7)
MCV: 89 fL (ref 79–97)
Platelets: 233 10*3/uL (ref 150–450)
RBC: 4.54 x10E6/uL (ref 4.14–5.80)
RDW: 12.4 % (ref 11.6–15.4)
WBC: 7.2 10*3/uL (ref 3.4–10.8)

## 2020-11-02 LAB — BASIC METABOLIC PANEL
BUN/Creatinine Ratio: 18 (ref 10–24)
BUN: 17 mg/dL (ref 8–27)
CO2: 23 mmol/L (ref 20–29)
Calcium: 9.4 mg/dL (ref 8.6–10.2)
Chloride: 103 mmol/L (ref 96–106)
Creatinine, Ser: 0.97 mg/dL (ref 0.76–1.27)
Glucose: 91 mg/dL (ref 65–99)
Potassium: 3.8 mmol/L (ref 3.5–5.2)
Sodium: 142 mmol/L (ref 134–144)
eGFR: 78 mL/min/{1.73_m2} (ref >59)

## 2020-11-02 NOTE — Telephone Encounter (Signed)
Pt did come for labs; labs currently not resulted. Continue to follow.

## 2020-11-03 NOTE — Telephone Encounter (Signed)
Prescription refill request for Eliquis received. Indication: Afib Last office visit: 04/25/20 Irish Lack) Scr: 0.97 (11/02/20) Age: 80 Weight: 59.1kg  Appropriate dose and refill sent to requested pharmacy.

## 2021-02-19 DIAGNOSIS — Z23 Encounter for immunization: Secondary | ICD-10-CM | POA: Diagnosis not present

## 2021-03-06 DIAGNOSIS — L578 Other skin changes due to chronic exposure to nonionizing radiation: Secondary | ICD-10-CM | POA: Diagnosis not present

## 2021-03-06 DIAGNOSIS — D225 Melanocytic nevi of trunk: Secondary | ICD-10-CM | POA: Diagnosis not present

## 2021-03-06 DIAGNOSIS — L309 Dermatitis, unspecified: Secondary | ICD-10-CM | POA: Diagnosis not present

## 2021-03-06 DIAGNOSIS — L905 Scar conditions and fibrosis of skin: Secondary | ICD-10-CM | POA: Diagnosis not present

## 2021-03-06 DIAGNOSIS — Z85828 Personal history of other malignant neoplasm of skin: Secondary | ICD-10-CM | POA: Diagnosis not present

## 2021-03-06 DIAGNOSIS — L821 Other seborrheic keratosis: Secondary | ICD-10-CM | POA: Diagnosis not present

## 2021-03-06 DIAGNOSIS — L814 Other melanin hyperpigmentation: Secondary | ICD-10-CM | POA: Diagnosis not present

## 2021-03-06 DIAGNOSIS — Z23 Encounter for immunization: Secondary | ICD-10-CM | POA: Diagnosis not present

## 2021-03-13 ENCOUNTER — Other Ambulatory Visit: Payer: Self-pay | Admitting: Interventional Cardiology

## 2021-03-14 ENCOUNTER — Other Ambulatory Visit: Payer: Self-pay | Admitting: Interventional Cardiology

## 2021-03-23 DIAGNOSIS — H5201 Hypermetropia, right eye: Secondary | ICD-10-CM | POA: Diagnosis not present

## 2021-03-23 DIAGNOSIS — Z961 Presence of intraocular lens: Secondary | ICD-10-CM | POA: Diagnosis not present

## 2021-03-23 DIAGNOSIS — H26493 Other secondary cataract, bilateral: Secondary | ICD-10-CM | POA: Diagnosis not present

## 2021-04-20 NOTE — Progress Notes (Signed)
Cardiology Office Note   Date:  04/21/2021   ID:  Vincent Hampton, Vincent Hampton 1941/01/09, MRN [id number]  PCP:  Hulan Fess, MD    Chief Complaint  Patient presents with   Follow-up   CAD, AFib  Wt Readings from Last 3 Encounters:  04/21/21 138 lb (62.6 kg)  04/25/20 130 lb 6.4 oz (59.1 kg)  04/24/19 126 lb (57.2 kg)       History of Present Illness: Vincent Hampton is a 81 y.o. male  who has had PAF. He had a prox LAD stent in Jan 2014 for unstable angina sx. His anginal equivalent was left arm pain. This has resolved post stent.   No AFib sx.   In 2020, he had double vision and there was concern for microinfarct.  Scan was ordered but he canceled this since the double vision went away.  He thinks it has returned in a mild way.    He has intentionally gained weight.    Denies : Chest pain. Dizziness. Leg edema. Nitroglycerin use. Orthopnea.  Paroxysmal nocturnal dyspnea. Shortness of breath. Syncope.    Rare palpitations.    Past Medical History:  Diagnosis Date   Atrial fibrillation ([place])    Chest pain, unspecified    Hypertension    Intermediate coronary syndrome ([place])    Mixed hyperlipidemia    Nonspecific abnormal unspecified cardiovascular function study    Prostate cancer [facility])     Past Surgical History:  Procedure Laterality Date   HERNIA REPAIR     x 2   PERCUTANEOUS CORONARY STENT INTERVENTION (PCI-S) N/A 05/02/2012   Procedure: PERCUTANEOUS CORONARY STENT INTERVENTION (PCI-S);  Surgeon: Jettie Booze, MD;  Location: [facility] CATH LAB;  Service: Cardiovascular;  Laterality: N/A;   PROSTATECTOMY       Current Outpatient Medications  Medication Sig Dispense Refill   allopurinol (ZYLOPRIM) 100 MG tablet Take 100 mg by mouth daily as needed (GOUT).      apixaban (ELIQUIS) 2.5 MG TABS tablet Take 1 tablet by mouth twice daily 180 tablet 1   cholecalciferol (VITAMIN D) 1000 units tablet Take 1,000 Units by mouth daily.     metoprolol tartrate  (LOPRESSOR) 25 MG tablet Take 1/2 (one-half) tablet by mouth twice daily 90 tablet 3   nitroGLYCERIN (NITROSTAT) 0.4 MG SL tablet PLACE 1 TABLET UNDER THE TONGUE EVERY 5 MINUTES AS NEEDED FOR CHEST PAIN (UP TO 3 DOSES BEFORE CALLING EMS) 25 tablet 4   PROAIR HFA 108 (90 BASE) MCG/ACT inhaler Inhale 2 puffs into the lungs every 6 (six) hours as needed for wheezing or shortness of breath.      simvastatin (ZOCOR) 40 MG tablet TAKE 1 TABLET BY MOUTH ONCE DAILY AT  6PM 90 tablet 3   sotalol (BETAPACE) 80 MG tablet Take 1 tablet by mouth twice daily 180 tablet 0   triamterene-hydrochlorothiazide (MAXZIDE-25) 37.5-25 MG tablet TAKE 1 TABLET BY MOUTH ONCE DAILY 90 tablet 2   UNABLE TO FIND Med Name: Ellwood Handler from [facility] for allergies     No current facility-administered medications for this visit.    Allergies:   Patient has no known allergies.    Social History:  The patient  reports that he has quit smoking. He has never used smokeless tobacco. He reports that he does not drink alcohol and does not use drugs.   Family History:  The patient's family history includes Alzheimer's disease in his mother; Cancer in his father; Lung cancer in his sister.  ROS:  Please see the history of present illness.   Otherwise, review of systems are positive for intentional weight gain.   All other systems are reviewed and negative.    PHYSICAL EXAM: VS:  BP 128/84    Pulse 75    Ht 6\' 2"  (1.88 m)    Wt 138 lb (62.6 kg)    SpO2 98%    BMI 17.72 kg/m  , BMI Body mass index is 17.72 kg/m. GEN: Well nourished, well developed, in no acute distress HEENT: normal Neck: no JVD, carotid bruits, or masses Cardiac: RRR; no murmurs, rubs, or gallops,no edema  Respiratory:  clear to auscultation bilaterally, normal work of breathing GI: soft, nontender, nondistended, + BS MS: no deformity or atrophy Skin: warm and dry, no rash Neuro:  Strength and sensation are intact Psych: euthymic mood, full affect   EKG:    The ekg ordered today demonstrates NSR, no ST changes   Recent Labs: 11/02/2020: BUN 17; Creatinine, Ser 0.97; Hemoglobin 13.9; Platelets 233; Potassium 3.8; Sodium 142   Lipid Panel    Component Value Date/Time   CHOL 170 06/21/2014 1049   TRIG 103.0 06/21/2014 1049   HDL 54.50 06/21/2014 1049   CHOLHDL 3 06/21/2014 1049   VLDL 20.6 06/21/2014 1049   LDLCALC 95 06/21/2014 1049     Other studies Reviewed: Additional studies/ records that were reviewed today with results demonstrating: .   ASSESSMENT AND PLAN:  CAD: No angina. COntinue aggressive secondary prevention.  We discussed stress testing since it has been nearly 9 years since his stent.  He feels that he is doing okay and is not interested in any stress test. AFib: Increase Eliquis to 5 mg BID since he is now over 60 kg.  Occasional episodes of AFib.  We will verify renal function as well again in 6 months after last check, in Feb 2023. HTN: The current medical regimen is effective;  continue present plan and medications. Hyperlipidemia: Continue Zocor for now.  We will recheck lipids when fasting.  May need to consider high potency statin. 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  Anticoagulated with Eliquis.   Current medicines are reviewed at length with the patient today.  The patient concerns regarding his medicines were addressed.  The following changes have been made:  No change  Labs/ tests ordered today include:  No orders of the defined types were placed in this encounter.   Recommend 150 minutes/week of aerobic exercise Low fat, low carb, high fiber diet recommended  Disposition:   FU in 1 year   Signed, Larae Grooms, MD  04/21/2021 1:37 PM    [facility] [address], [place], [place]  [address] Phone: [phone]; Fax: [phone]

## 2021-04-21 ENCOUNTER — Encounter: Payer: Self-pay | Admitting: Interventional Cardiology

## 2021-04-21 ENCOUNTER — Other Ambulatory Visit: Payer: Self-pay

## 2021-04-21 ENCOUNTER — Ambulatory Visit (INDEPENDENT_AMBULATORY_CARE_PROVIDER_SITE_OTHER): Payer: Medicare Other | Admitting: Interventional Cardiology

## 2021-04-21 ENCOUNTER — Telehealth: Payer: Self-pay | Admitting: *Deleted

## 2021-04-21 VITALS — BP 128/84 | HR 75 | Ht 74.0 in | Wt 138.0 lb

## 2021-04-21 DIAGNOSIS — I480 Paroxysmal atrial fibrillation: Secondary | ICD-10-CM

## 2021-04-21 DIAGNOSIS — I10 Essential (primary) hypertension: Secondary | ICD-10-CM

## 2021-04-21 DIAGNOSIS — E782 Mixed hyperlipidemia: Secondary | ICD-10-CM

## 2021-04-21 DIAGNOSIS — I25118 Atherosclerotic heart disease of native coronary artery with other forms of angina pectoris: Secondary | ICD-10-CM

## 2021-04-21 DIAGNOSIS — D6869 Other thrombophilia: Secondary | ICD-10-CM

## 2021-04-21 MED ORDER — APIXABAN 5 MG PO TABS: 5.0000 mg | ORAL_TABLET | Freq: Two times a day (BID) | ORAL | 3 refills | Status: DC

## 2021-04-21 NOTE — Patient Instructions (Signed)
Medication Instructions:  Your physician has recommended you make the following change in your medication: Increase Eliquis to 5 mg by mouth twice daily  *If you need a refill on your cardiac medications before your next appointment, please call your pharmacy*   Lab Work: none If you have labs (blood work) drawn today and your tests are completely normal, you will receive your results only by: [place] (if you have MyChart) OR A paper copy in the mail If you have any lab test that is abnormal or we need to change your treatment, we will call you to review the results.   Testing/Procedures: none   Follow-Up: At [facility], you and your health needs are our priority.  As part of our continuing mission to provide you with exceptional heart care, we have created designated Provider Care Teams.  These Care Teams include your primary Cardiologist (physician) and Advanced Practice Providers (APPs -  Physician Assistants and Nurse Practitioners) who all work together to provide you with the care you need, when you need it.  We recommend signing up for the patient portal called "MyChart".  Sign up information is provided on this After Visit Summary.  MyChart is used to connect with patients for Virtual Visits (Telemedicine).  Patients are able to view lab/test results, encounter notes, upcoming appointments, etc.  Non-urgent messages can be sent to your provider as well.   To learn more about what you can do with MyChart, go to [email or web address].    Your next appointment:   12 month(s)  The format for your next appointment:   In Person  Provider:   Larae Grooms, MD     Other Instructions

## 2021-04-21 NOTE — Telephone Encounter (Signed)
Dr Irish Lack would like to check CBC, CMET and lipids in February.

## 2021-04-21 NOTE — Telephone Encounter (Signed)
Left message to call office

## 2021-05-15 NOTE — Telephone Encounter (Signed)
Left message to call office

## 2021-05-17 ENCOUNTER — Other Ambulatory Visit: Payer: Self-pay | Admitting: Interventional Cardiology

## 2021-05-22 NOTE — Telephone Encounter (Signed)
I spoke with patient and he will come in for fasting lab work on May 25, 2021

## 2021-05-25 ENCOUNTER — Other Ambulatory Visit: Payer: Self-pay

## 2021-05-25 ENCOUNTER — Other Ambulatory Visit: Payer: Medicare Other

## 2021-05-25 DIAGNOSIS — E782 Mixed hyperlipidemia: Secondary | ICD-10-CM | POA: Diagnosis not present

## 2021-05-25 DIAGNOSIS — I480 Paroxysmal atrial fibrillation: Secondary | ICD-10-CM | POA: Diagnosis not present

## 2021-05-25 LAB — LIPID PANEL
Chol/HDL Ratio: 2.3 ratio (ref 0.0–5.0)
Cholesterol, Total: 160 mg/dL (ref 100–199)
HDL: 70 mg/dL (ref >39)
LDL Chol Calc (NIH): 76 mg/dL (ref 0–99)
Triglycerides: 70 mg/dL (ref 0–149)
VLDL Cholesterol Cal: 14 mg/dL (ref 5–40)

## 2021-05-25 LAB — COMPREHENSIVE METABOLIC PANEL
ALT: 20 IU/L (ref 0–44)
AST: 24 IU/L (ref 0–40)
Albumin/Globulin Ratio: 2 (ref 1.2–2.2)
Albumin: 4 g/dL (ref 3.6–4.6)
Alkaline Phosphatase: 93 IU/L (ref 44–121)
BUN/Creatinine Ratio: 15 (ref 10–24)
BUN: 16 mg/dL (ref 8–27)
Bilirubin Total: 0.5 mg/dL (ref 0.0–1.2)
CO2: 27 mmol/L (ref 20–29)
Calcium: 9.4 mg/dL (ref 8.6–10.2)
Chloride: 103 mmol/L (ref 96–106)
Creatinine, Ser: 1.07 mg/dL (ref 0.76–1.27)
Globulin, Total: 2 g/dL (ref 1.5–4.5)
Glucose: 97 mg/dL (ref 70–99)
Potassium: 4.5 mmol/L (ref 3.5–5.2)
Sodium: 142 mmol/L (ref 134–144)
Total Protein: 6 g/dL (ref 6.0–8.5)
eGFR: 70 mL/min/{1.73_m2} (ref >59)

## 2021-05-25 LAB — CBC
Hematocrit: 41.9 % (ref 37.5–51.0)
Hemoglobin: 14.3 g/dL (ref 13.0–17.7)
MCH: 31.5 pg (ref 26.6–33.0)
MCHC: 34.1 g/dL (ref 31.5–35.7)
MCV: 92 fL (ref 79–97)
Platelets: 213 10*3/uL (ref 150–450)
RBC: 4.54 x10E6/uL (ref 4.14–5.80)
RDW: 12.7 % (ref 11.6–15.4)
WBC: 5.7 10*3/uL (ref 3.4–10.8)

## 2021-06-07 ENCOUNTER — Other Ambulatory Visit: Payer: Self-pay | Admitting: Interventional Cardiology

## 2021-07-11 DIAGNOSIS — Z20822 Contact with and (suspected) exposure to covid-19: Secondary | ICD-10-CM | POA: Diagnosis not present

## 2021-08-08 DIAGNOSIS — L219 Seborrheic dermatitis, unspecified: Secondary | ICD-10-CM | POA: Diagnosis not present

## 2021-11-03 DIAGNOSIS — M109 Gout, unspecified: Secondary | ICD-10-CM | POA: Diagnosis not present

## 2021-11-03 DIAGNOSIS — I10 Essential (primary) hypertension: Secondary | ICD-10-CM | POA: Diagnosis not present

## 2021-11-03 DIAGNOSIS — R7301 Impaired fasting glucose: Secondary | ICD-10-CM | POA: Diagnosis not present

## 2021-11-03 DIAGNOSIS — I480 Paroxysmal atrial fibrillation: Secondary | ICD-10-CM | POA: Diagnosis not present

## 2021-11-03 DIAGNOSIS — I2510 Atherosclerotic heart disease of native coronary artery without angina pectoris: Secondary | ICD-10-CM | POA: Diagnosis not present

## 2022-01-28 DIAGNOSIS — Z23 Encounter for immunization: Secondary | ICD-10-CM | POA: Diagnosis not present

## 2022-04-03 DIAGNOSIS — Z961 Presence of intraocular lens: Secondary | ICD-10-CM | POA: Diagnosis not present

## 2022-04-03 DIAGNOSIS — H26493 Other secondary cataract, bilateral: Secondary | ICD-10-CM | POA: Diagnosis not present

## 2022-04-03 DIAGNOSIS — H43813 Vitreous degeneration, bilateral: Secondary | ICD-10-CM | POA: Diagnosis not present

## 2022-04-03 DIAGNOSIS — H5201 Hypermetropia, right eye: Secondary | ICD-10-CM | POA: Diagnosis not present

## 2022-04-23 DIAGNOSIS — Z681 Body mass index (BMI) 19 or less, adult: Secondary | ICD-10-CM | POA: Diagnosis not present

## 2022-04-23 DIAGNOSIS — J00 Acute nasopharyngitis [common cold]: Secondary | ICD-10-CM | POA: Diagnosis not present

## 2022-05-02 DIAGNOSIS — L309 Dermatitis, unspecified: Secondary | ICD-10-CM | POA: Diagnosis not present

## 2022-05-02 DIAGNOSIS — L821 Other seborrheic keratosis: Secondary | ICD-10-CM | POA: Diagnosis not present

## 2022-05-02 DIAGNOSIS — D225 Melanocytic nevi of trunk: Secondary | ICD-10-CM | POA: Diagnosis not present

## 2022-05-02 DIAGNOSIS — Z85828 Personal history of other malignant neoplasm of skin: Secondary | ICD-10-CM | POA: Diagnosis not present

## 2022-05-02 DIAGNOSIS — L578 Other skin changes due to chronic exposure to nonionizing radiation: Secondary | ICD-10-CM | POA: Diagnosis not present

## 2022-05-02 DIAGNOSIS — L905 Scar conditions and fibrosis of skin: Secondary | ICD-10-CM | POA: Diagnosis not present

## 2022-05-02 DIAGNOSIS — L814 Other melanin hyperpigmentation: Secondary | ICD-10-CM | POA: Diagnosis not present

## 2022-05-08 NOTE — Progress Notes (Unsigned)
Office Visit    Patient Name: Vincent Hampton Date of Encounter: 05/09/2022  PCP:  Hulan Fess, MD   [place]  Cardiologist:  Larae Grooms, MD  Advanced Practice Provider:  No care team member to display Electrophysiologist:  None    HPI    Vincent Hampton is a 82 y.o. male with a past medical history of PAF, proximal LAD stent January 2014 for unstable angina, hypertension, hyperlipidemia, and prostate cancer presents today for follow-up appointment.  In 2020, he had double vision and there was concern for microinfarct.  Scan was ordered but he canceled this since the double vision went away.  He thinks it has returned in a mild way.  He had intentionally gained weight.  He was last seen 04/21/2021 and at that time denied chest pain, dizziness, leg edema, nitroglycerin use, orthopnea, paroxysmal nocturnal dyspnea, shortness of breath, and syncope.  He did endorse rare palpitations.  Today, he tells me that he does not have the energy to do the things he used to do.  He is not working as much and is semiretired.  His wife fell right before Thanksgiving and he has been taking care of her.  She has gone through physical therapy and now has been cleared to drive.  Some stress has been lifted off of his shoulders.  No shortness of breath.  On metoprolol and sotalol for history of PAF.  He tells me his worst problem is his back.  He has scoliosis and a pinched nerve.  Surgery is not an option.  His A-fib has been stable and has not acted up in a while.  He is on Eliquis and tolerating well.  Sometimes his vision is affected and he can tell when he is in A-fib due to this.  Blood pressure is low normal today.  Usually in the 175Z systolic when he checks at home.  Reports no chest pain, pressure, or tightness. No edema, orthopnea, PND.   Past Medical History    Past Medical History:  Diagnosis Date   Atrial fibrillation ([facility])    Chest pain, unspecified     Hypertension    Intermediate coronary syndrome ([place])    Mixed hyperlipidemia    Nonspecific abnormal unspecified cardiovascular function study    Prostate cancer [facility])    Past Surgical History:  Procedure Laterality Date   HERNIA REPAIR     x 2   PERCUTANEOUS CORONARY STENT INTERVENTION (PCI-S) N/A 05/02/2012   Procedure: PERCUTANEOUS CORONARY STENT INTERVENTION (PCI-S);  Surgeon: Jettie Booze, MD;  Location: [facility] CATH LAB;  Service: Cardiovascular;  Laterality: N/A;   PROSTATECTOMY      Allergies  No Known Allergies  EKGs/Labs/Other Studies Reviewed:   The following studies were reviewed today: No recent studies  EKG:  EKG is  ordered today.  The ekg ordered today demonstrates normal sinus rhythm, RBBB (old), left ventricular hypertrophy  Recent Labs: 05/25/2021: ALT 20; BUN 16; Creatinine, Ser 1.07; Hemoglobin 14.3; Platelets 213; Potassium 4.5; Sodium 142  Recent Lipid Panel    Component Value Date/Time   CHOL 160 05/25/2021 1020   TRIG 70 05/25/2021 1020   HDL 70 05/25/2021 1020   CHOLHDL 2.3 05/25/2021 1020   CHOLHDL 3 06/21/2014 1049   VLDL 20.6 06/21/2014 1049   LDLCALC 76 05/25/2021 1020    Risk Assessment/Calculations:   CHA2DS2-VASc Score = 4   This indicates a 4.8% annual risk of stroke. The patient's score is based upon:  CHF History: 0 HTN History: 1 Diabetes History: 0 Stroke History: 0 Vascular Disease History: 1 Age Score: 2 Gender Score: 0    Home Medications   Current Meds  Medication Sig   allopurinol (ZYLOPRIM) 100 MG tablet Take 100 mg by mouth daily as needed (GOUT).    apixaban (ELIQUIS) 5 MG TABS tablet Take 1 tablet (5 mg total) by mouth 2 (two) times daily.   cholecalciferol (VITAMIN D) 1000 units tablet Take 1,000 Units by mouth daily.   nitroGLYCERIN (NITROSTAT) 0.4 MG SL tablet PLACE 1 TABLET UNDER THE TONGUE EVERY 5 MINUTES AS NEEDED FOR CHEST PAIN (UP TO 3 DOSES BEFORE CALLING EMS)   PROAIR HFA 108 (90 BASE) MCG/ACT  inhaler Inhale 2 puffs into the lungs every 6 (six) hours as needed for wheezing or shortness of breath.    simvastatin (ZOCOR) 40 MG tablet TAKE 1 TABLET BY MOUTH ONCE DAILY AT  6PM   sotalol (BETAPACE) 80 MG tablet Take 1 tablet by mouth twice daily   triamterene-hydrochlorothiazide (MAXZIDE-25) 37.5-25 MG tablet TAKE 1 TABLET BY MOUTH ONCE DAILY   UNABLE TO FIND Med Name: Ellwood Handler from [facility] for allergies     Review of Systems      All other systems reviewed and are otherwise negative except as noted above.  Physical Exam    VS:  BP 98/72   Pulse 75   Ht '6\' 2"'$  (1.88 m)   Wt 134 lb 3.2 oz (60.9 kg)   SpO2 95%   BMI 17.23 kg/m  , BMI Body mass index is 17.23 kg/m.  Wt Readings from Last 3 Encounters:  05/09/22 134 lb 3.2 oz (60.9 kg)  04/21/21 138 lb (62.6 kg)  04/25/20 130 lb 6.4 oz (59.1 kg)     GEN: Well nourished, well developed, in no acute distress. HEENT: normal. Neck: Supple, no JVD, carotid bruits, or masses. Cardiac: RRR, no murmurs, rubs, or gallops. No clubbing, cyanosis, edema.  Radials/PT 2+ and equal bilaterally.  Respiratory:  Respirations regular and unlabored, clear to auscultation bilaterally. GI: Soft, nontender, nondistended. MS: No deformity or atrophy. Skin: Warm and dry, no rash. Neuro:  Strength and sensation are intact. Psych: Normal affect.  Assessment & Plan    CAD -no chest pain or SOB -He does have decreased exercise tolerance -would defer an ischemic workup for now, if this continues with increase in activity would consider workup  Atrial fibrillation -rare palpitations -continue sotalol -continue Eliquis , no bleeding issues  Hypertension -well controlled -continue current medication regimen  Hyperlipidemia -LDL 76 -continue simvastatin '40mg'$  daily  Anticoagulated -no issues with bleeding -continue for CHA2DS2-VASc score of 4         Disposition: Follow up 1 year with Larae Grooms, MD or APP.  Signed, Elgie Collard, PA-C 05/09/2022, 5:29 PM [facility]

## 2022-05-09 ENCOUNTER — Ambulatory Visit: Payer: Medicare Other | Attending: Physician Assistant | Admitting: Physician Assistant

## 2022-05-09 ENCOUNTER — Telehealth: Payer: Self-pay | Admitting: Interventional Cardiology

## 2022-05-09 VITALS — BP 98/72 | HR 75 | Ht 74.0 in | Wt 134.2 lb

## 2022-05-09 DIAGNOSIS — I25118 Atherosclerotic heart disease of native coronary artery with other forms of angina pectoris: Secondary | ICD-10-CM | POA: Diagnosis not present

## 2022-05-09 DIAGNOSIS — D6869 Other thrombophilia: Secondary | ICD-10-CM | POA: Insufficient documentation

## 2022-05-09 DIAGNOSIS — E782 Mixed hyperlipidemia: Secondary | ICD-10-CM | POA: Insufficient documentation

## 2022-05-09 DIAGNOSIS — I10 Essential (primary) hypertension: Secondary | ICD-10-CM

## 2022-05-09 DIAGNOSIS — I480 Paroxysmal atrial fibrillation: Secondary | ICD-10-CM | POA: Insufficient documentation

## 2022-05-09 DIAGNOSIS — I4891 Unspecified atrial fibrillation: Secondary | ICD-10-CM | POA: Insufficient documentation

## 2022-05-09 NOTE — Telephone Encounter (Signed)
Pt c/o medication issue:  1. Name of Medication: Metoprolol Tartrate 25 mg   2. How are you currently taking this medication (dosage and times per day)?   3. Are you having a reaction (difficulty breathing--STAT)?   4. What is your medication issue? Patients wife called and stated that patient was advised to stop taking this medication. Pt's wife states that this has been recommended by other professionals in the past, but Dr. Irish Lack had always told patient to continue taking. Requesting call back to get clarification on this and to see if Dr. Irish Lack wants patient to discontinue med.

## 2022-05-09 NOTE — Patient Instructions (Addendum)
Medication Instructions:  1.Stop metoprolol tartrate *If you need a refill on your cardiac medications before your next appointment, please call your pharmacy*   Lab Work: None ordered If you have labs (blood work) drawn today and your tests are completely normal, you will receive your results only by: [place] (if you have MyChart) OR A paper copy in the mail If you have any lab test that is abnormal or we need to change your treatment, we will call you to review the results.  Follow-Up: At [facility], you and your health needs are our priority.  As part of our continuing mission to provide you with exceptional heart care, we have created designated Provider Care Teams.  These Care Teams include your primary Cardiologist (physician) and Advanced Practice Providers (APPs -  Physician Assistants and Nurse Practitioners) who all work together to provide you with the care you need, when you need it.   Your next appointment:   1 year(s)  Provider:   Larae Grooms, MD    Other Instructions Check your blood pressure daily, one hour after taking your morning medications for 2 weeks, keep a log and send [place] the readings through mychart at the end of the 2 weeks.  Omron is the brand of home blood pressure monitor that we discussed today.

## 2022-05-18 ENCOUNTER — Ambulatory Visit: Payer: Medicare Other | Attending: Interventional Cardiology | Admitting: Interventional Cardiology

## 2022-05-18 ENCOUNTER — Encounter: Payer: Self-pay | Admitting: Interventional Cardiology

## 2022-05-18 VITALS — BP 110/68 | HR 67 | Ht 74.0 in | Wt 130.2 lb

## 2022-05-18 DIAGNOSIS — D6869 Other thrombophilia: Secondary | ICD-10-CM | POA: Diagnosis not present

## 2022-05-18 DIAGNOSIS — E782 Mixed hyperlipidemia: Secondary | ICD-10-CM

## 2022-05-18 DIAGNOSIS — I10 Essential (primary) hypertension: Secondary | ICD-10-CM

## 2022-05-18 DIAGNOSIS — I25118 Atherosclerotic heart disease of native coronary artery with other forms of angina pectoris: Secondary | ICD-10-CM | POA: Diagnosis not present

## 2022-05-18 DIAGNOSIS — I480 Paroxysmal atrial fibrillation: Secondary | ICD-10-CM

## 2022-05-18 MED ORDER — METOPROLOL TARTRATE 25 MG PO TABS: 12.5000 mg | ORAL_TABLET | Freq: Two times a day (BID) | ORAL | 3 refills | Status: DC

## 2022-05-18 NOTE — Progress Notes (Signed)
Cardiology Office Note   Date:  05/18/2022   ID:  [name], [name] 1940/04/05, MRN [id number]  PCP:  Hulan Fess, MD    No chief complaint on file.  CAD, AFib  Wt Readings from Last 3 Encounters:  05/18/22 130 lb 3.2 oz (59.1 kg)  05/09/22 134 lb 3.2 oz (60.9 kg)  04/21/21 138 lb (62.6 kg)       History of Present Illness: Vincent Hampton is a 82 y.o. male   who has had PAF. He had a prox LAD stent in Jan 2014 for unstable angina sx. His anginal equivalent was left arm pain. This has resolved post stent.   No AFib sx.   In 2020, he had double vision and there was concern for microinfarct.  Scan was ordered but he canceled this since the double vision went away.  He thinks it has returned in a mild way.    He has intentionally gained weight.       Past Medical History:  Diagnosis Date   Atrial fibrillation ([place])    Chest pain, unspecified    Hypertension    Intermediate coronary syndrome ([place])    Mixed hyperlipidemia    Nonspecific abnormal unspecified cardiovascular function study    Prostate cancer [facility])     Past Surgical History:  Procedure Laterality Date   HERNIA REPAIR     x 2   PERCUTANEOUS CORONARY STENT INTERVENTION (PCI-S) N/A 05/02/2012   Procedure: PERCUTANEOUS CORONARY STENT INTERVENTION (PCI-S);  Surgeon: Jettie Booze, MD;  Location: [facility] CATH LAB;  Service: Cardiovascular;  Laterality: N/A;   PROSTATECTOMY       Current Outpatient Medications  Medication Sig Dispense Refill   allopurinol (ZYLOPRIM) 100 MG tablet Take 100 mg by mouth daily as needed (GOUT).      apixaban (ELIQUIS) 5 MG TABS tablet Take 1 tablet (5 mg total) by mouth 2 (two) times daily. 180 tablet 3   cholecalciferol (VITAMIN D) 1000 units tablet Take 1,000 Units by mouth daily.     fluocinonide (LIDEX) 0.05 % external solution APPLY 1 APPLICATION TO AFFECTED AREA ON SCALP TWICE DAILY for 14     nitroGLYCERIN (NITROSTAT) 0.4 MG SL tablet PLACE 1 TABLET UNDER  THE TONGUE EVERY 5 MINUTES AS NEEDED FOR CHEST PAIN (UP TO 3 DOSES BEFORE CALLING EMS) 25 tablet 4   PROAIR HFA 108 (90 BASE) MCG/ACT inhaler Inhale 2 puffs into the lungs every 6 (six) hours as needed for wheezing or shortness of breath.      pyrithione zinc (HEAD AND SHOULDERS) 1 % shampoo 1 application in place of regular shampoo Externally Two times a Week     simvastatin (ZOCOR) 40 MG tablet TAKE 1 TABLET BY MOUTH ONCE DAILY AT  6PM 90 tablet 3   sotalol (BETAPACE) 80 MG tablet Take 1 tablet by mouth twice daily 180 tablet 3   tiZANidine (ZANAFLEX) 2 MG tablet      triamcinolone cream (KENALOG) 0.1 % 1 application to affected area Externally Twice a day for 14 days     triamterene-hydrochlorothiazide (MAXZIDE-25) 37.5-25 MG tablet TAKE 1 TABLET BY MOUTH ONCE DAILY 90 tablet 2   UNABLE TO FIND Med Name: Ellwood Handler from [facility] for allergies     No current facility-administered medications for this visit.    Allergies:   Patient has no known allergies.    Social History:  The patient  reports that he has quit smoking. He has never used smokeless tobacco.  He reports that he does not drink alcohol and does not use drugs.   Family History:  The patient's family history includes Alzheimer's disease in his mother; Cancer in his father; Lung cancer in his sister.    ROS:  Please see the history of present illness.   Otherwise, review of systems are positive for difficulty gaining weight; decreased balance; back pain.   All other systems are reviewed and negative.    PHYSICAL EXAM: VS:  BP 110/68   Pulse 67   Ht 6' 2"$  (1.88 m)   Wt 130 lb 3.2 oz (59.1 kg)   SpO2 99%   BMI 16.72 kg/m  , BMI Body mass index is 16.72 kg/m. GEN: Well nourished, well developed, in no acute distress HEENT: normal Neck: no JVD, carotid bruits, or masses Cardiac: RRR; no murmurs, rubs, or gallops,no edema  Respiratory:  clear to auscultation bilaterally, normal work of breathing GI: soft, nontender,  nondistended, + BS MS: no deformity or atrophy Skin: warm and dry, no rash Neuro:  Strength and sensation are intact Psych: euthymic mood, full affect   EKG:   The ekg ordered today demonstrates NSR, RBBB, LAFB   Recent Labs: 05/25/2021: ALT 20; BUN 16; Creatinine, Ser 1.07; Hemoglobin 14.3; Platelets 213; Potassium 4.5; Sodium 142   Lipid Panel    Component Value Date/Time   CHOL 160 05/25/2021 1020   TRIG 70 05/25/2021 1020   HDL 70 05/25/2021 1020   CHOLHDL 2.3 05/25/2021 1020   CHOLHDL 3 06/21/2014 1049   VLDL 20.6 06/21/2014 1049   LDLCALC 76 05/25/2021 1020     Other studies Reviewed: Additional studies/ records that were reviewed today with results demonstrating: LDL 76, HDL 70 in 2023.   ASSESSMENT AND PLAN:  CAD: No angina.  Refill SL NTG.  On Eliquis so no antiplatelet therapy currently. Atrial fibrillation: COntinue Sotalol. He did not stop metoprolol after the visit with [name].  Feels that he is feeling well so did not want to change.  Could hold metoprolol to see if there is any improvement in balance.  Refill metoprolol 12.5 BID.  I stressed the importance of avoiding falling. Hypertension: The current medical regimen is effective;  continue present plan and medications.  He checks at home and they have been controlled. Hyperlipidemia: The current medical regimen is effective;  continue present plan and medications. Anticoagulated/acquired thrombophilia: Based on weight, he is on the border of ELiquis 2.5 mg BID and 5 mg BID.  Due to recent weight loss due to stress, will him take 2.5 mg twice a day but if weight increases over 60 kg which is just a couple pounds away,Then the 5 mg twice a day dose would be indicated.  He is trying to get back to 138 pounds where he was a few weeks ago.   Current medicines are reviewed at length with the patient today.  The patient concerns regarding his medicines were addressed.  The following changes have been made:  No  change  Labs/ tests ordered today include:  No orders of the defined types were placed in this encounter.   Recommend 150 minutes/week of aerobic exercise Low fat, low carb, high fiber diet recommended  Disposition:   FU in 1 year   Signed, Larae Grooms, MD  05/18/2022 1:51 PM    [facility] [place], [place], [place]  [address] Phone: [phone]; Fax: [phone]

## 2022-05-18 NOTE — Patient Instructions (Signed)

## 2022-05-21 ENCOUNTER — Other Ambulatory Visit: Payer: Self-pay

## 2022-05-21 MED ORDER — NITROGLYCERIN 0.4 MG SL SUBL: SUBLINGUAL_TABLET | SUBLINGUAL | 2 refills | Status: AC

## 2022-05-21 NOTE — Telephone Encounter (Signed)
Pt's medication was sent to pt's pharmacy as requested. Confirmation received.  °

## 2022-06-15 ENCOUNTER — Other Ambulatory Visit: Payer: Self-pay

## 2022-06-15 MED ORDER — SOTALOL HCL 80 MG PO TABS: 80.0000 mg | ORAL_TABLET | Freq: Two times a day (BID) | ORAL | 3 refills | Status: DC

## 2022-06-25 ENCOUNTER — Other Ambulatory Visit: Payer: Self-pay | Admitting: Interventional Cardiology

## 2022-09-12 ENCOUNTER — Telehealth: Payer: Self-pay | Admitting: Interventional Cardiology

## 2022-09-12 DIAGNOSIS — Z5181 Encounter for therapeutic drug level monitoring: Secondary | ICD-10-CM

## 2022-09-12 DIAGNOSIS — I480 Paroxysmal atrial fibrillation: Secondary | ICD-10-CM

## 2022-09-12 NOTE — Telephone Encounter (Signed)
*  STAT* If patient is at the pharmacy, call can be transferred to refill team.   1. Which medications need to be refilled? (please list name of each medication and dose if known)  apixaban (ELIQUIS) 5 MG TABS tablet   2. Which pharmacy/location (including street and city if local pharmacy) is medication to be sent to?  [facility] [id number] [place], [place] - [address]    3. Do they need a 30 day or 90 day supply? 90

## 2022-09-12 NOTE — Telephone Encounter (Addendum)
Pt last saw Dr Eldridge Dace 05/18/22, last labs 05/25/21, pt is overdue for labwork.  Called pt's PCP he has not had labwork done at PCP since 2022.  Pt will need to come in for CBC and BMP for refill.  Called spoke with pt, pt will come into [facility] office on Friday for labwork.  Orders for CBC and BMP in Epic, appt made.  Will await results to refill rx.

## 2022-09-13 NOTE — Telephone Encounter (Signed)
This encounter was created in error - please disregard.

## 2022-09-14 ENCOUNTER — Ambulatory Visit: Payer: Medicare Other | Attending: Interventional Cardiology

## 2022-09-14 DIAGNOSIS — I480 Paroxysmal atrial fibrillation: Secondary | ICD-10-CM | POA: Diagnosis not present

## 2022-09-14 DIAGNOSIS — Z5181 Encounter for therapeutic drug level monitoring: Secondary | ICD-10-CM | POA: Diagnosis not present

## 2022-09-15 LAB — BASIC METABOLIC PANEL
BUN/Creatinine Ratio: 17 (ref 10–24)
BUN: 16 mg/dL (ref 8–27)
CO2: 26 mmol/L (ref 20–29)
Calcium: 9.2 mg/dL (ref 8.6–10.2)
Chloride: 104 mmol/L (ref 96–106)
Creatinine, Ser: 0.96 mg/dL (ref 0.76–1.27)
Glucose: 94 mg/dL (ref 70–99)
Potassium: 4 mmol/L (ref 3.5–5.2)
Sodium: 143 mmol/L (ref 134–144)
eGFR: 78 mL/min/{1.73_m2} (ref >59)

## 2022-09-15 LAB — CBC
Hematocrit: 42.8 % (ref 37.5–51.0)
Hemoglobin: 14.2 g/dL (ref 13.0–17.7)
MCH: 30.9 pg (ref 26.6–33.0)
MCHC: 33.2 g/dL (ref 31.5–35.7)
MCV: 93 fL (ref 79–97)
Platelets: 242 10*3/uL (ref 150–450)
RBC: 4.6 x10E6/uL (ref 4.14–5.80)
RDW: 12.9 % (ref 11.6–15.4)
WBC: 5.9 10*3/uL (ref 3.4–10.8)

## 2022-09-17 ENCOUNTER — Other Ambulatory Visit: Payer: Self-pay

## 2022-09-17 MED ORDER — APIXABAN 5 MG PO TABS: 5.0000 mg | ORAL_TABLET | Freq: Two times a day (BID) | ORAL | 3 refills | Status: DC

## 2022-09-17 NOTE — Telephone Encounter (Signed)
Prescription refill request for Eliquis received. Indication:AFIB Last office visit:2/24 Scr:0.96  6/24 Age:82  Weight:59.1  KG  PRESCRIPTION REFILLED

## 2023-02-19 DIAGNOSIS — Z23 Encounter for immunization: Secondary | ICD-10-CM | POA: Diagnosis not present

## 2023-04-09 DIAGNOSIS — Z961 Presence of intraocular lens: Secondary | ICD-10-CM | POA: Diagnosis not present

## 2023-04-09 DIAGNOSIS — H26493 Other secondary cataract, bilateral: Secondary | ICD-10-CM | POA: Diagnosis not present

## 2023-04-09 DIAGNOSIS — H524 Presbyopia: Secondary | ICD-10-CM | POA: Diagnosis not present

## 2023-04-09 DIAGNOSIS — H43813 Vitreous degeneration, bilateral: Secondary | ICD-10-CM | POA: Diagnosis not present

## 2023-05-08 ENCOUNTER — Other Ambulatory Visit: Payer: Self-pay | Admitting: Interventional Cardiology

## 2023-05-21 ENCOUNTER — Encounter: Payer: Self-pay | Admitting: Cardiovascular Disease

## 2023-05-21 ENCOUNTER — Ambulatory Visit: Payer: Medicare Other | Attending: Cardiovascular Disease | Admitting: Cardiovascular Disease

## 2023-05-21 VITALS — BP 130/76 | HR 54 | Ht 73.0 in | Wt 130.2 lb

## 2023-05-21 DIAGNOSIS — I10 Essential (primary) hypertension: Secondary | ICD-10-CM | POA: Insufficient documentation

## 2023-05-21 DIAGNOSIS — E782 Mixed hyperlipidemia: Secondary | ICD-10-CM | POA: Diagnosis not present

## 2023-05-21 DIAGNOSIS — I2510 Atherosclerotic heart disease of native coronary artery without angina pectoris: Secondary | ICD-10-CM | POA: Diagnosis not present

## 2023-05-21 DIAGNOSIS — I480 Paroxysmal atrial fibrillation: Secondary | ICD-10-CM | POA: Insufficient documentation

## 2023-05-21 LAB — LIPID PANEL
Chol/HDL Ratio: 2.3 {ratio} (ref 0.0–5.0)
Cholesterol, Total: 172 mg/dL (ref 100–199)
HDL: 76 mg/dL (ref >39)
LDL Chol Calc (NIH): 80 mg/dL (ref 0–99)
Triglycerides: 86 mg/dL (ref 0–149)
VLDL Cholesterol Cal: 16 mg/dL (ref 5–40)

## 2023-05-21 LAB — HEPATIC FUNCTION PANEL
ALT: 31 [IU]/L (ref 0–44)
AST: 28 [IU]/L (ref 0–40)
Albumin: 4.4 g/dL (ref 3.7–4.7)
Alkaline Phosphatase: 103 [IU]/L (ref 44–121)
Bilirubin Total: 0.5 mg/dL (ref 0.0–1.2)
Bilirubin, Direct: 0.18 mg/dL (ref 0.00–0.40)
Total Protein: 6.9 g/dL (ref 6.0–8.5)

## 2023-05-21 NOTE — Assessment & Plan Note (Signed)
 History of PAF maintaining sinus rhythm on sotalol and Eliquis oral anticoagulation.

## 2023-05-21 NOTE — Progress Notes (Signed)
 05/21/2023 ALDOUS HOUSEL   [birth year]  [id number]  Primary Physician Catha Gosselin, MD Primary Cardiologist: Runell Gess MD Milagros Loll, [place], [place]  HPI:  Vincent Hampton is a 83 y.o. thin and fit appearing married Caucasian male father of 3 children, grandfather of 5 grandchildren who has been in [employer] system since 1972.  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  He was previously a patient of Dr. Eldridge Dace.  I am assuming his care in Dr. Hoyle Barr absence.  His risk factors include remote tobacco abuse having quit in 1983.  He has treated hypertension and hyperlipidemia.  There is no family history of heart disease.  He did have proximately stenting in 2014.  He has had PAF with what sounds like a stroke in the past currently maintaining sinus rhythm on Eliquis and sotalol.  He is fairly active and works in his yard and is asymptomatic.   Current Meds  Medication Sig   allopurinol (ZYLOPRIM) 100 MG tablet Take 100 mg by mouth daily as needed (GOUT).    apixaban (ELIQUIS) 5 MG TABS tablet Take 1 tablet (5 mg total) by mouth 2 (two) times daily.   cholecalciferol (VITAMIN D) 1000 units tablet Take 1,000 Units by mouth daily.   fluocinonide (LIDEX) 0.05 % external solution APPLY 1 APPLICATION TO AFFECTED AREA ON SCALP TWICE DAILY for 14   metoprolol tartrate (LOPRESSOR) 25 MG tablet TAKE ONE-HALF (0.5) TABLET BY MOUTH TWO TIMES A DAY   nitroGLYCERIN (NITROSTAT) 0.4 MG SL tablet PLACE 1 TABLET UNDER THE TONGUE EVERY 5 MINUTES AS NEEDED FOR CHEST PAIN (UP TO 3 DOSES BEFORE CALLING EMS)   PROAIR HFA 108 (90 BASE) MCG/ACT inhaler Inhale 2 puffs into the lungs every 6 (six) hours as needed for wheezing or shortness of breath.    pyrithione zinc (HEAD AND SHOULDERS) 1 % shampoo 1 application in place of regular shampoo Externally Two times a Week   simvastatin (ZOCOR) 40 MG tablet TAKE 1 TABLET BY MOUTH DAILY AT 6P.M.   sotalol (BETAPACE) 80 MG tablet  Take 1 tablet (80 mg total) by mouth 2 (two) times daily.   triamcinolone cream (KENALOG) 0.1 % 1 application to affected area Externally Twice a day for 14 days   triamterene-hydrochlorothiazide (MAXZIDE-25) 37.5-25 MG tablet TAKE 1 TABLET BY MOUTH ONCE DAILY   UNABLE TO FIND Med Name: Sydnee Levans from [facility] for allergies     No Known Allergies  Social History   Socioeconomic History   Marital status: Married    Spouse name: Diane   Number of children: Not on file   Years of education: Not on file   Highest education level: Some college, no degree  Occupational History    Comment: part time  Tobacco Use   Smoking status: Former   Smokeless tobacco: Never  Advertising account planner   Vaping status: Never Used  Substance and Sexual Activity   Alcohol use: No    Alcohol/week: 0.0 standard drinks of alcohol   Drug use: No   Sexual activity: Not on file  Other Topics Concern   Not on file  Social History Narrative   Lives with wife   Caffeine- coffee 1-2 cups daily   Social Drivers of Corporate investment banker Strain: Not on file  Food Insecurity: Not on file  Transportation Needs: Not on file  Physical Activity: Not on file  Stress: Not on file  Social Connections: Not on file  Intimate  Partner Violence: Not on file     Review of Systems: General: negative for chills, fever, night sweats or weight changes.  Cardiovascular: negative for chest pain, dyspnea on exertion, edema, orthopnea, palpitations, paroxysmal nocturnal dyspnea or shortness of breath Dermatological: negative for rash Respiratory: negative for cough or wheezing Urologic: negative for hematuria Abdominal: negative for nausea, vomiting, diarrhea, bright red blood per rectum, melena, or hematemesis Neurologic: negative for visual changes, syncope, or dizziness All other systems reviewed and are otherwise negative except as noted above.    Blood pressure (!) 158/92, pulse (!) 54, height 6\' 1"  (1.854 m), weight 130  lb 3.2 oz (59.1 kg).  General appearance: alert and no distress Neck: no adenopathy, no carotid bruit, no JVD, supple, symmetrical, trachea midline, and thyroid not enlarged, symmetric, no tenderness/mass/nodules Lungs: clear to auscultation bilaterally Heart: regular rate and rhythm, S1, S2 normal, no murmur, click, rub or gallop Extremities: extremities normal, atraumatic, no cyanosis or edema Pulses: 2+ and symmetric Skin: Skin color, texture, turgor normal. No rashes or lesions Neurologic: Grossly normal  EKG EKG Interpretation Date/Time:  Tuesday May 21 2023 10:30:11 EST Ventricular Rate:  54 PR Interval:  228 QRS Duration:  122 QT Interval:  508 QTC Calculation: 481 R Axis:   -47  Text Interpretation: Sinus bradycardia with 1st degree A-V block Left axis deviation Inferior infarct , age undetermined Anteroseptal infarct , age undetermined When compared with ECG of 09-Nov-2015 10:04, PR interval has increased Right bundle branch block is no longer Present Anteroseptal infarct is now Present Inferior infarct is now Present Confirmed by Nanetta Batty [phone]) on 05/21/2023 10:31:52 AM    ASSESSMENT AND PLAN:   Mixed hyperlipidemia History hyperlipidemia on statin therapy lipid profile performed 05/05/2021 revealing total cholesterol 160, LDL 76 and HDL 70.  We will recheck a lipid liver profile.  Atrial fibrillation (HCC) History of PAF maintaining sinus rhythm on sotalol and Eliquis oral anticoagulation.  Hypertension History of essential hypertension blood pressure measured today 158/92.  He is on metoprolol.  Coronary atherosclerosis of native coronary artery History of CAD status post proximal LAD stenting in 2014.  He is been asymptomatic since.     Runell Gess MD FACP,FACC,FAHA, [facility] 05/21/2023 10:44 AM

## 2023-05-21 NOTE — Assessment & Plan Note (Signed)
 History of essential hypertension blood pressure measured today 158/92.  He is on metoprolol.

## 2023-05-21 NOTE — Assessment & Plan Note (Signed)
 History of CAD status post proximal LAD stenting in 2014.  He is been asymptomatic since.

## 2023-05-21 NOTE — Assessment & Plan Note (Signed)
 History hyperlipidemia on statin therapy lipid profile performed 05/05/2021 revealing total cholesterol 160, LDL 76 and HDL 70.  We will recheck a lipid liver profile.

## 2023-05-21 NOTE — Patient Instructions (Signed)
 Medication Instructions:  Your physician recommends that you continue on your current medications as directed. Please refer to the Current Medication list given to you today.  *If you need a refill on your cardiac medications before your next appointment, please call your pharmacy*   Lab Work: Your physician recommends that you have labs drawn today: Lipid/liver panel  If you have labs (blood work) drawn today and your tests are completely normal, you will receive your results only by: MyChart Message (if you have MyChart) OR A paper copy in the mail If you have any lab test that is abnormal or we need to change your treatment, we will call you to review the results.   Follow-Up: At [facility], you and your health needs are our priority.  As part of our continuing mission to provide you with exceptional heart care, we have created designated Provider Care Teams.  These Care Teams include your primary Cardiologist (physician) and Advanced Practice Providers (APPs -  Physician Assistants and Nurse Practitioners) who all work together to provide you with the care you need, when you need it.  We recommend signing up for the patient portal called "MyChart".  Sign up information is provided on this After Visit Summary.  MyChart is used to connect with patients for Virtual Visits (Telemedicine).  Patients are able to view lab/test results, encounter notes, upcoming appointments, etc.  Non-urgent messages can be sent to your provider as well.   To learn more about what you can do with MyChart, go to [email or web address].    Your next appointment:   12 month(s)  Provider:   Nanetta Batty, MD     Other Instructions

## 2023-05-23 ENCOUNTER — Encounter: Payer: Self-pay | Admitting: *Deleted

## 2023-05-29 DIAGNOSIS — Z85828 Personal history of other malignant neoplasm of skin: Secondary | ICD-10-CM | POA: Diagnosis not present

## 2023-05-29 DIAGNOSIS — L905 Scar conditions and fibrosis of skin: Secondary | ICD-10-CM | POA: Diagnosis not present

## 2023-05-29 DIAGNOSIS — L814 Other melanin hyperpigmentation: Secondary | ICD-10-CM | POA: Diagnosis not present

## 2023-05-29 DIAGNOSIS — D225 Melanocytic nevi of trunk: Secondary | ICD-10-CM | POA: Diagnosis not present

## 2023-05-29 DIAGNOSIS — S90222A Contusion of left lesser toe(s) with damage to nail, initial encounter: Secondary | ICD-10-CM | POA: Diagnosis not present

## 2023-05-29 DIAGNOSIS — L821 Other seborrheic keratosis: Secondary | ICD-10-CM | POA: Diagnosis not present

## 2023-05-29 DIAGNOSIS — L578 Other skin changes due to chronic exposure to nonionizing radiation: Secondary | ICD-10-CM | POA: Diagnosis not present

## 2023-06-19 ENCOUNTER — Other Ambulatory Visit: Payer: Self-pay | Admitting: Interventional Cardiology

## 2023-07-22 ENCOUNTER — Other Ambulatory Visit: Payer: Self-pay | Admitting: Interventional Cardiology

## 2023-08-06 DIAGNOSIS — R7301 Impaired fasting glucose: Secondary | ICD-10-CM | POA: Diagnosis not present

## 2023-08-06 DIAGNOSIS — I2510 Atherosclerotic heart disease of native coronary artery without angina pectoris: Secondary | ICD-10-CM | POA: Diagnosis not present

## 2023-08-06 DIAGNOSIS — I480 Paroxysmal atrial fibrillation: Secondary | ICD-10-CM | POA: Diagnosis not present

## 2023-08-06 DIAGNOSIS — Z681 Body mass index (BMI) 19 or less, adult: Secondary | ICD-10-CM | POA: Diagnosis not present

## 2023-08-06 DIAGNOSIS — E7800 Pure hypercholesterolemia, unspecified: Secondary | ICD-10-CM | POA: Diagnosis not present

## 2023-08-06 DIAGNOSIS — I10 Essential (primary) hypertension: Secondary | ICD-10-CM | POA: Diagnosis not present

## 2023-08-12 ENCOUNTER — Other Ambulatory Visit: Payer: Self-pay | Admitting: *Deleted

## 2023-08-12 MED ORDER — METOPROLOL TARTRATE 25 MG PO TABS: 12.5000 mg | ORAL_TABLET | Freq: Two times a day (BID) | ORAL | 0 refills | Status: DC

## 2023-09-12 DIAGNOSIS — R233 Spontaneous ecchymoses: Secondary | ICD-10-CM | POA: Diagnosis not present

## 2023-09-12 DIAGNOSIS — W57XXXA Bitten or stung by nonvenomous insect and other nonvenomous arthropods, initial encounter: Secondary | ICD-10-CM | POA: Diagnosis not present

## 2023-09-12 DIAGNOSIS — S90561A Insect bite (nonvenomous), right ankle, initial encounter: Secondary | ICD-10-CM | POA: Diagnosis not present

## 2023-10-28 ENCOUNTER — Telehealth: Payer: Self-pay | Admitting: Cardiovascular Disease

## 2023-10-28 ENCOUNTER — Other Ambulatory Visit: Payer: Self-pay | Admitting: Interventional Cardiology

## 2023-10-28 ENCOUNTER — Other Ambulatory Visit: Payer: Self-pay

## 2023-10-28 MED ORDER — APIXABAN 5 MG PO TABS
5.0000 mg | ORAL_TABLET | Freq: Two times a day (BID) | ORAL | 5 refills | Status: AC
Start: 2023-10-28 — End: ?

## 2023-10-28 NOTE — Telephone Encounter (Signed)
*  STAT* If patient is at the pharmacy, call can be transferred to refill team.   1. Which medications need to be refilled? (please list name of each medication and dose if known)  apixaban  (ELIQUIS ) 5 MG TABS tablet  2. Which pharmacy/location (including street and city if local pharmacy) is medication to be sent to? [facility] [id number] [place] [place], [place] - [address]  3. Do they need a 30 day or 90 day supply?  30 day supply

## 2023-10-28 NOTE — Telephone Encounter (Signed)
 Pt c/o medication issue:  1. Name of Medication:  apixaban  (ELIQUIS ) 5 MG TABS tablet  2. How are you currently taking this medication (dosage and times per day)?   3. Are you having a reaction (difficulty breathing--STAT)?  \  4. What is your medication issue?   Patient says Eliquis  is too expensive and he would like to discuss other options for blood thinner. Please advise.

## 2023-10-28 NOTE — Telephone Encounter (Signed)
 Called patient about message. Informed patient that he should call his insurance company and see what medication they will cover and how much it would cost the patient. Patient stated he will check with his insurance and if any of the other medications are going to be about the same, he might just stay on the eliquis . Patient will call back to ask the provider about any other blood thinner his insurance might cover more to see if he can be on it.

## 2023-10-28 NOTE — Telephone Encounter (Signed)
 Prescription refill request for Eliquis  received. Indication:afib Last office visit:2/25 Scr:0.96  2024 Age: 83 Weight:59.1  kg   Prescription refilled

## 2023-11-06 ENCOUNTER — Other Ambulatory Visit: Payer: Self-pay

## 2023-11-06 MED ORDER — METOPROLOL TARTRATE 25 MG PO TABS: 12.5000 mg | ORAL_TABLET | Freq: Two times a day (BID) | ORAL | 1 refills | Status: DC

## 2024-02-10 DIAGNOSIS — L820 Inflamed seborrheic keratosis: Secondary | ICD-10-CM | POA: Diagnosis not present

## 2024-02-10 DIAGNOSIS — J3489 Other specified disorders of nose and nasal sinuses: Secondary | ICD-10-CM | POA: Diagnosis not present

## 2024-02-12 DIAGNOSIS — Z23 Encounter for immunization: Secondary | ICD-10-CM | POA: Diagnosis not present

## 2024-04-27 ENCOUNTER — Other Ambulatory Visit: Payer: Self-pay | Admitting: Cardiovascular Disease
